# Patient Record
Sex: Female | Born: 1975 | Race: White | Hispanic: No | Marital: Married | Smoking: Former smoker
Health system: Southern US, Community
[De-identification: ages and names within clinical notes are randomized; demographics above are authoritative.]

## PROBLEM LIST (undated history)

## (undated) ENCOUNTER — Inpatient Hospital Stay (HOSPITAL_COMMUNITY): Payer: Self-pay

## (undated) DIAGNOSIS — N200 Calculus of kidney: Secondary | ICD-10-CM

## (undated) DIAGNOSIS — O99210 Obesity complicating pregnancy, unspecified trimester: Secondary | ICD-10-CM

## (undated) DIAGNOSIS — O34219 Maternal care for unspecified type scar from previous cesarean delivery: Secondary | ICD-10-CM

## (undated) DIAGNOSIS — Z87891 Personal history of nicotine dependence: Secondary | ICD-10-CM

## (undated) HISTORY — DX: Maternal care for unspecified type scar from previous cesarean delivery: O34.219

## (undated) HISTORY — DX: Obesity complicating pregnancy, unspecified trimester: O99.210

---

## 2003-01-21 ENCOUNTER — Encounter: Admission: RE | Admit: 2003-01-21 | Discharge: 2003-01-21 | Payer: Self-pay | Admitting: Family Medicine

## 2003-11-10 ENCOUNTER — Emergency Department (HOSPITAL_COMMUNITY): Admission: EM | Admit: 2003-11-10 | Discharge: 2003-11-10 | Payer: Self-pay | Admitting: Emergency Medicine

## 2004-01-25 HISTORY — PX: NEUROPLASTY / TRANSPOSITION MEDIAN NERVE AT CARPAL TUNNEL: SUR893

## 2007-02-28 ENCOUNTER — Emergency Department (HOSPITAL_COMMUNITY): Admission: EM | Admit: 2007-02-28 | Discharge: 2007-02-28 | Payer: Self-pay | Admitting: Emergency Medicine

## 2007-09-22 ENCOUNTER — Emergency Department (HOSPITAL_BASED_OUTPATIENT_CLINIC_OR_DEPARTMENT_OTHER): Admission: EM | Admit: 2007-09-22 | Discharge: 2007-09-23 | Payer: Self-pay | Admitting: Emergency Medicine

## 2007-09-27 ENCOUNTER — Emergency Department (HOSPITAL_COMMUNITY): Admission: EM | Admit: 2007-09-27 | Discharge: 2007-09-27 | Payer: Self-pay | Admitting: Emergency Medicine

## 2007-10-09 ENCOUNTER — Emergency Department (HOSPITAL_COMMUNITY): Admission: EM | Admit: 2007-10-09 | Discharge: 2007-10-09 | Payer: Self-pay | Admitting: Family Medicine

## 2008-11-06 ENCOUNTER — Emergency Department (HOSPITAL_COMMUNITY): Admission: EM | Admit: 2008-11-06 | Discharge: 2008-11-06 | Payer: Self-pay | Admitting: Family Medicine

## 2009-05-29 ENCOUNTER — Emergency Department (HOSPITAL_COMMUNITY): Admission: EM | Admit: 2009-05-29 | Discharge: 2009-05-29 | Payer: Self-pay | Admitting: Emergency Medicine

## 2009-06-09 ENCOUNTER — Inpatient Hospital Stay (HOSPITAL_COMMUNITY): Admission: AD | Admit: 2009-06-09 | Discharge: 2009-06-09 | Payer: Self-pay | Admitting: Obstetrics and Gynecology

## 2009-06-12 ENCOUNTER — Ambulatory Visit: Admission: RE | Admit: 2009-06-12 | Discharge: 2009-06-12 | Payer: Self-pay | Admitting: Family Medicine

## 2010-01-24 NOTE — L&D Delivery Note (Signed)
Delivery Note At 5:26 PM a viable female was delivered via VBAC, Spontaneous (Presentation:LOA ;  ).  APGAR: , ; weight .   Placenta status: spont via shultz, .  Cord:3 vc  with the following complications: none.   Anesthesia: Epidural  Episiotomy: None Lacerations: None Suture Repair: none Est. Blood Loss350 (mL):   Mom to postpartum.  Baby to nursery-stable.  Zerita Boers 10/10/2010, 5:39 PM

## 2010-02-11 ENCOUNTER — Inpatient Hospital Stay (HOSPITAL_COMMUNITY)
Admission: AD | Admit: 2010-02-11 | Discharge: 2010-02-11 | Payer: Self-pay | Source: Home / Self Care | Admitting: Obstetrics & Gynecology

## 2010-02-15 LAB — URINE MICROSCOPIC-ADD ON

## 2010-02-15 LAB — URINALYSIS, ROUTINE W REFLEX MICROSCOPIC
Bilirubin Urine: NEGATIVE
Ketones, ur: NEGATIVE mg/dL
Leukocytes, UA: NEGATIVE
Nitrite: NEGATIVE
Protein, ur: NEGATIVE mg/dL
Specific Gravity, Urine: 1.01 (ref 1.005–1.030)
Urine Glucose, Fasting: NEGATIVE mg/dL
Urobilinogen, UA: 0.2 mg/dL (ref 0.0–1.0)
pH: 6.5 (ref 5.0–8.0)

## 2010-02-15 LAB — CBC
HCT: 36.5 % (ref 36.0–46.0)
Hemoglobin: 12.4 g/dL (ref 12.0–15.0)
MCH: 33.6 pg (ref 26.0–34.0)
MCHC: 34 g/dL (ref 30.0–36.0)
MCV: 98.9 fL (ref 78.0–100.0)
Platelets: 242 10*3/uL (ref 150–400)
RBC: 3.69 MIL/uL — ABNORMAL LOW (ref 3.87–5.11)
RDW: 12.7 % (ref 11.5–15.5)
WBC: 11.2 10*3/uL — ABNORMAL HIGH (ref 4.0–10.5)

## 2010-02-15 LAB — WET PREP, GENITAL
Clue Cells Wet Prep HPF POC: NONE SEEN
Trich, Wet Prep: NONE SEEN
Yeast Wet Prep HPF POC: NONE SEEN

## 2010-02-15 LAB — POCT PREGNANCY, URINE: Preg Test, Ur: POSITIVE

## 2010-02-15 LAB — GC/CHLAMYDIA PROBE AMP, GENITAL
Chlamydia, DNA Probe: NEGATIVE
GC Probe Amp, Genital: NEGATIVE

## 2010-02-15 LAB — HCG, QUANTITATIVE, PREGNANCY: hCG, Beta Chain, Quant, S: 24040 m[IU]/mL — ABNORMAL HIGH (ref <5)

## 2010-04-12 LAB — URINALYSIS, ROUTINE W REFLEX MICROSCOPIC
Bilirubin Urine: NEGATIVE
Glucose, UA: NEGATIVE mg/dL
Ketones, ur: NEGATIVE mg/dL
Leukocytes, UA: NEGATIVE
Nitrite: NEGATIVE
Protein, ur: NEGATIVE mg/dL
Specific Gravity, Urine: 1.025 (ref 1.005–1.030)
Urobilinogen, UA: 1 mg/dL (ref 0.0–1.0)
pH: 7 (ref 5.0–8.0)

## 2010-04-12 LAB — GC/CHLAMYDIA PROBE AMP, GENITAL
Chlamydia, DNA Probe: NEGATIVE
GC Probe Amp, Genital: NEGATIVE

## 2010-04-12 LAB — HCG, QUANTITATIVE, PREGNANCY
hCG, Beta Chain, Quant, S: 11 m[IU]/mL — ABNORMAL HIGH (ref <5)
hCG, Beta Chain, Quant, S: 4 m[IU]/mL (ref <5)

## 2010-04-12 LAB — URINE MICROSCOPIC-ADD ON

## 2010-04-12 LAB — HCG, SERUM, QUALITATIVE: Preg, Serum: POSITIVE — AB

## 2010-04-12 LAB — POCT PREGNANCY, URINE: Preg Test, Ur: NEGATIVE

## 2010-04-12 LAB — ABO/RH: ABO/RH(D): O POS

## 2010-04-12 LAB — WET PREP, GENITAL
Clue Cells Wet Prep HPF POC: NONE SEEN
Trich, Wet Prep: NONE SEEN
Yeast Wet Prep HPF POC: NONE SEEN

## 2010-04-29 LAB — POCT URINALYSIS DIP (DEVICE)
Bilirubin Urine: NEGATIVE
Glucose, UA: NEGATIVE mg/dL
Ketones, ur: NEGATIVE mg/dL
Nitrite: NEGATIVE
Protein, ur: NEGATIVE mg/dL
Specific Gravity, Urine: 1.01 (ref 1.005–1.030)
Urobilinogen, UA: 0.2 mg/dL (ref 0.0–1.0)
pH: 7 (ref 5.0–8.0)

## 2010-04-29 LAB — WET PREP, GENITAL
Trich, Wet Prep: NONE SEEN
Yeast Wet Prep HPF POC: NONE SEEN

## 2010-04-29 LAB — POCT PREGNANCY, URINE: Preg Test, Ur: NEGATIVE

## 2010-04-29 LAB — GC/CHLAMYDIA PROBE AMP, GENITAL: Chlamydia, DNA Probe: NEGATIVE

## 2010-05-17 ENCOUNTER — Other Ambulatory Visit (HOSPITAL_COMMUNITY): Payer: Self-pay | Admitting: Obstetrics and Gynecology

## 2010-05-17 DIAGNOSIS — Z0489 Encounter for examination and observation for other specified reasons: Secondary | ICD-10-CM

## 2010-05-17 DIAGNOSIS — O09529 Supervision of elderly multigravida, unspecified trimester: Secondary | ICD-10-CM

## 2010-05-18 ENCOUNTER — Other Ambulatory Visit (HOSPITAL_COMMUNITY)
Admission: RE | Admit: 2010-05-18 | Discharge: 2010-05-18 | Disposition: A | Discharge status: Home / Self Care | Payer: 59 | Source: Ambulatory Visit | Attending: Obstetrics and Gynecology | Admitting: Obstetrics and Gynecology

## 2010-05-18 ENCOUNTER — Encounter: Payer: 59 | Admitting: Obstetrics and Gynecology

## 2010-05-18 ENCOUNTER — Other Ambulatory Visit: Payer: Self-pay | Admitting: Obstetrics & Gynecology

## 2010-05-18 DIAGNOSIS — Z01419 Encounter for gynecological examination (general) (routine) without abnormal findings: Secondary | ICD-10-CM | POA: Insufficient documentation

## 2010-05-18 DIAGNOSIS — Z113 Encounter for screening for infections with a predominantly sexual mode of transmission: Secondary | ICD-10-CM | POA: Insufficient documentation

## 2010-05-18 DIAGNOSIS — Z3480 Encounter for supervision of other normal pregnancy, unspecified trimester: Secondary | ICD-10-CM

## 2010-05-18 DIAGNOSIS — O34219 Maternal care for unspecified type scar from previous cesarean delivery: Secondary | ICD-10-CM

## 2010-05-18 DIAGNOSIS — Z3689 Encounter for other specified antenatal screening: Secondary | ICD-10-CM

## 2010-05-19 ENCOUNTER — Other Ambulatory Visit (HOSPITAL_COMMUNITY): Payer: Self-pay

## 2010-05-19 ENCOUNTER — Ambulatory Visit (HOSPITAL_COMMUNITY)
Admission: RE | Admit: 2010-05-19 | Discharge: 2010-05-19 | Disposition: A | Discharge status: Home / Self Care | Payer: 59 | Source: Ambulatory Visit | Attending: Obstetrics & Gynecology | Admitting: Obstetrics & Gynecology

## 2010-05-19 DIAGNOSIS — O09529 Supervision of elderly multigravida, unspecified trimester: Secondary | ICD-10-CM | POA: Insufficient documentation

## 2010-05-19 DIAGNOSIS — Z3689 Encounter for other specified antenatal screening: Secondary | ICD-10-CM

## 2010-05-19 DIAGNOSIS — O358XX0 Maternal care for other (suspected) fetal abnormality and damage, not applicable or unspecified: Secondary | ICD-10-CM | POA: Insufficient documentation

## 2010-05-19 DIAGNOSIS — Z363 Encounter for antenatal screening for malformations: Secondary | ICD-10-CM | POA: Insufficient documentation

## 2010-05-19 DIAGNOSIS — Z1389 Encounter for screening for other disorder: Secondary | ICD-10-CM | POA: Insufficient documentation

## 2010-05-26 ENCOUNTER — Other Ambulatory Visit: Payer: Self-pay | Admitting: Obstetrics & Gynecology

## 2010-06-15 ENCOUNTER — Encounter (INDEPENDENT_AMBULATORY_CARE_PROVIDER_SITE_OTHER): Payer: 59 | Admitting: Obstetrics & Gynecology

## 2010-06-15 DIAGNOSIS — O09529 Supervision of elderly multigravida, unspecified trimester: Secondary | ICD-10-CM

## 2010-06-15 DIAGNOSIS — Z3480 Encounter for supervision of other normal pregnancy, unspecified trimester: Secondary | ICD-10-CM

## 2010-06-15 DIAGNOSIS — O34219 Maternal care for unspecified type scar from previous cesarean delivery: Secondary | ICD-10-CM

## 2010-07-13 ENCOUNTER — Encounter (INDEPENDENT_AMBULATORY_CARE_PROVIDER_SITE_OTHER): Payer: 59 | Admitting: Obstetrics & Gynecology

## 2010-07-13 DIAGNOSIS — Z3480 Encounter for supervision of other normal pregnancy, unspecified trimester: Secondary | ICD-10-CM

## 2010-07-13 DIAGNOSIS — O34219 Maternal care for unspecified type scar from previous cesarean delivery: Secondary | ICD-10-CM

## 2010-07-13 DIAGNOSIS — O09529 Supervision of elderly multigravida, unspecified trimester: Secondary | ICD-10-CM

## 2010-07-19 ENCOUNTER — Other Ambulatory Visit (INDEPENDENT_AMBULATORY_CARE_PROVIDER_SITE_OTHER): Payer: 59

## 2010-07-19 DIAGNOSIS — Z3400 Encounter for supervision of normal first pregnancy, unspecified trimester: Secondary | ICD-10-CM

## 2010-07-19 LAB — RPR: RPR: NONREACTIVE

## 2010-07-19 LAB — RUBELLA ANTIBODY, IGM: Rubella: IMMUNE

## 2010-07-19 LAB — HIV ANTIBODY (ROUTINE TESTING W REFLEX): HIV: NONREACTIVE

## 2010-07-27 ENCOUNTER — Encounter (INDEPENDENT_AMBULATORY_CARE_PROVIDER_SITE_OTHER): Payer: 59 | Admitting: Obstetrics & Gynecology

## 2010-07-27 DIAGNOSIS — O34219 Maternal care for unspecified type scar from previous cesarean delivery: Secondary | ICD-10-CM

## 2010-07-27 DIAGNOSIS — Z3480 Encounter for supervision of other normal pregnancy, unspecified trimester: Secondary | ICD-10-CM

## 2010-07-27 DIAGNOSIS — O09529 Supervision of elderly multigravida, unspecified trimester: Secondary | ICD-10-CM

## 2010-07-27 DIAGNOSIS — IMO0002 Reserved for concepts with insufficient information to code with codable children: Secondary | ICD-10-CM

## 2010-08-16 ENCOUNTER — Encounter (INDEPENDENT_AMBULATORY_CARE_PROVIDER_SITE_OTHER): Payer: 59 | Admitting: Obstetrics and Gynecology

## 2010-08-16 DIAGNOSIS — IMO0002 Reserved for concepts with insufficient information to code with codable children: Secondary | ICD-10-CM

## 2010-08-16 DIAGNOSIS — O34219 Maternal care for unspecified type scar from previous cesarean delivery: Secondary | ICD-10-CM

## 2010-08-16 DIAGNOSIS — O09529 Supervision of elderly multigravida, unspecified trimester: Secondary | ICD-10-CM

## 2010-08-16 DIAGNOSIS — Z3480 Encounter for supervision of other normal pregnancy, unspecified trimester: Secondary | ICD-10-CM

## 2010-09-06 ENCOUNTER — Ambulatory Visit (INDEPENDENT_AMBULATORY_CARE_PROVIDER_SITE_OTHER): Payer: 59 | Admitting: Obstetrics and Gynecology

## 2010-09-06 DIAGNOSIS — IMO0002 Reserved for concepts with insufficient information to code with codable children: Secondary | ICD-10-CM

## 2010-09-06 DIAGNOSIS — Z3480 Encounter for supervision of other normal pregnancy, unspecified trimester: Secondary | ICD-10-CM

## 2010-09-08 ENCOUNTER — Other Ambulatory Visit: Payer: Self-pay | Admitting: Obstetrics and Gynecology

## 2010-09-08 DIAGNOSIS — O3660X0 Maternal care for excessive fetal growth, unspecified trimester, not applicable or unspecified: Secondary | ICD-10-CM

## 2010-09-15 ENCOUNTER — Ambulatory Visit (HOSPITAL_COMMUNITY)
Admission: RE | Admit: 2010-09-15 | Discharge: 2010-09-15 | Disposition: A | Discharge status: Home / Self Care | Payer: 59 | Source: Ambulatory Visit | Attending: Obstetrics and Gynecology | Admitting: Obstetrics and Gynecology

## 2010-09-15 ENCOUNTER — Ambulatory Visit (INDEPENDENT_AMBULATORY_CARE_PROVIDER_SITE_OTHER): Payer: 59 | Admitting: Obstetrics and Gynecology

## 2010-09-15 DIAGNOSIS — O3660X0 Maternal care for excessive fetal growth, unspecified trimester, not applicable or unspecified: Secondary | ICD-10-CM | POA: Insufficient documentation

## 2010-09-15 DIAGNOSIS — O09529 Supervision of elderly multigravida, unspecified trimester: Secondary | ICD-10-CM | POA: Insufficient documentation

## 2010-09-15 DIAGNOSIS — IMO0002 Reserved for concepts with insufficient information to code with codable children: Secondary | ICD-10-CM

## 2010-09-15 DIAGNOSIS — O34219 Maternal care for unspecified type scar from previous cesarean delivery: Secondary | ICD-10-CM

## 2010-09-15 DIAGNOSIS — Z3480 Encounter for supervision of other normal pregnancy, unspecified trimester: Secondary | ICD-10-CM

## 2010-09-16 LAB — GC/CHLAMYDIA PROBE AMP, GENITAL: GC Probe Amp, Genital: NEGATIVE

## 2010-09-22 ENCOUNTER — Ambulatory Visit (INDEPENDENT_AMBULATORY_CARE_PROVIDER_SITE_OTHER): Payer: 59 | Admitting: Obstetrics & Gynecology

## 2010-09-22 DIAGNOSIS — Z3480 Encounter for supervision of other normal pregnancy, unspecified trimester: Secondary | ICD-10-CM

## 2010-09-22 DIAGNOSIS — K219 Gastro-esophageal reflux disease without esophagitis: Secondary | ICD-10-CM

## 2010-09-22 DIAGNOSIS — O34219 Maternal care for unspecified type scar from previous cesarean delivery: Secondary | ICD-10-CM

## 2010-09-22 MED ORDER — PANTOPRAZOLE SODIUM 40 MG PO TBEC: 40.0000 mg | DELAYED_RELEASE_TABLET | Freq: Every day | ORAL | Status: DC

## 2010-09-29 ENCOUNTER — Encounter (INDEPENDENT_AMBULATORY_CARE_PROVIDER_SITE_OTHER): Payer: 59 | Admitting: Obstetrics and Gynecology

## 2010-09-29 DIAGNOSIS — Z3480 Encounter for supervision of other normal pregnancy, unspecified trimester: Secondary | ICD-10-CM

## 2010-10-04 ENCOUNTER — Other Ambulatory Visit: Payer: Self-pay | Admitting: Obstetrics and Gynecology

## 2010-10-04 ENCOUNTER — Ambulatory Visit: Payer: 59 | Admitting: Obstetrics and Gynecology

## 2010-10-04 DIAGNOSIS — O480 Post-term pregnancy: Secondary | ICD-10-CM

## 2010-10-04 DIAGNOSIS — Z7189 Other specified counseling: Secondary | ICD-10-CM

## 2010-10-08 ENCOUNTER — Ambulatory Visit (HOSPITAL_COMMUNITY)
Admission: RE | Admit: 2010-10-08 | Discharge: 2010-10-08 | Disposition: A | Discharge status: Home / Self Care | Payer: 59 | Source: Ambulatory Visit | Attending: Obstetrics and Gynecology | Admitting: Obstetrics and Gynecology

## 2010-10-08 ENCOUNTER — Encounter (HOSPITAL_COMMUNITY): Payer: Self-pay

## 2010-10-08 ENCOUNTER — Telehealth (HOSPITAL_COMMUNITY): Payer: Self-pay | Admitting: *Deleted

## 2010-10-08 ENCOUNTER — Inpatient Hospital Stay (HOSPITAL_COMMUNITY)
Admission: AD | Admit: 2010-10-08 | Discharge: 2010-10-08 | Disposition: A | Discharge status: Home / Self Care | Payer: 59 | Source: Ambulatory Visit | Attending: Obstetrics & Gynecology | Admitting: Obstetrics & Gynecology

## 2010-10-08 DIAGNOSIS — O480 Post-term pregnancy: Secondary | ICD-10-CM | POA: Insufficient documentation

## 2010-10-08 DIAGNOSIS — O09529 Supervision of elderly multigravida, unspecified trimester: Secondary | ICD-10-CM | POA: Insufficient documentation

## 2010-10-08 DIAGNOSIS — Z36 Encounter for antenatal screening of mother: Secondary | ICD-10-CM

## 2010-10-08 DIAGNOSIS — Z3689 Encounter for other specified antenatal screening: Secondary | ICD-10-CM

## 2010-10-08 NOTE — ED Provider Notes (Signed)
History     Chief Complaint  Patient presents with  . Non-stress Test    Pt is [redacted]w[redacted]d here for BPP and NST.   HPI Pt receives prenatal care at [facility]. She reports positive fetal movement. She denies vaginal bleeding, leakage of fluid and contractions.   OB History    Grav Para Term Preterm Abortions TAB SAB Ect Mult Living   2 1 1  0 0 0 0 0 0 1      Past Medical History  Diagnosis Date  . Previous cesarean delivery, antepartum condition or complication   . Maternal obesity syndrome, antepartum     Past Surgical History  Procedure Date  . Cesarean section     No family history on file.  History  Substance Use Topics  . Smoking status: Former Games developer  . Smokeless tobacco: Not on file  . Alcohol Use: No    Allergies:  Allergies  Allergen Reactions  . Codeine Hives    Prescriptions prior to admission  Medication Sig Dispense Refill  . Calcium Carbonate (CALCIUM 500 PO) Take 1 tablet by mouth daily.        . calcium carbonate (TUMS - DOSED IN MG ELEMENTAL CALCIUM) 500 MG chewable tablet Chew 1 tablet by mouth daily. For heartburn.       . cholecalciferol (VITAMIN D) 1000 UNITS tablet Take 1,000 Units by mouth daily.        . vitamin C (ASCORBIC ACID) 250 MG tablet Take 250 mg by mouth daily.        Burnis Medin w/o A-FE-DSS-Methfol-FA (PRENATAL MULTIVITAMIN) 90-600-400 MG-MCG-MCG tablet Take 1 tablet by mouth daily.          Review of Systems  Constitutional: Negative.   HENT: Negative.   Gastrointestinal: Negative.     Physical Exam   There were no vitals taken for this visit.  Physical Exam  Nursing note and vitals reviewed. Constitutional: She appears well-developed and well-nourished. No distress.  GI:       Gravid, +FH, vertex, 7 lb 6 oz by Leopolds.   Musculoskeletal: She exhibits no edema.   Dilation: 1  Effacement (%): 20  Cervical Position: Posterior Station: -3  Presentation: Vertex Exam by:: Dr. Armen Pickup   BPP: 8/8 NST: reactive, 4  accles in 20 minutes.  FHR: 140 baseline, +accels 15x15 , no decels.   MAU Course  Procedures: BPP, NST, digital cervical exam  MDM D/c to home.   Assessment and Plan  35 yo F at [redacted]w[redacted]d here for intrapartum fetal surveillance for postdates. IOL scheduled from Tuesday 10/12/10. 1. Reassuring fetal surveillance not in active labor: plan to discharge to home, instructions of labor and kick counts. F/u in 4 days for scheduled induction.   Donna Stevens 10/08/2010, 9:57 AM

## 2010-10-10 ENCOUNTER — Inpatient Hospital Stay (HOSPITAL_COMMUNITY)
Admission: AD | Admit: 2010-10-10 | Discharge: 2010-10-12 | DRG: 775 | Disposition: A | Discharge status: Home / Self Care | Payer: 59 | Source: Ambulatory Visit | Attending: Obstetrics and Gynecology | Admitting: Obstetrics and Gynecology

## 2010-10-10 ENCOUNTER — Inpatient Hospital Stay (HOSPITAL_COMMUNITY): Payer: 59 | Admitting: Anesthesiology

## 2010-10-10 ENCOUNTER — Encounter (HOSPITAL_COMMUNITY): Payer: Self-pay | Admitting: *Deleted

## 2010-10-10 ENCOUNTER — Encounter (HOSPITAL_COMMUNITY): Payer: Self-pay | Admitting: Anesthesiology

## 2010-10-10 DIAGNOSIS — O34219 Maternal care for unspecified type scar from previous cesarean delivery: Principal | ICD-10-CM | POA: Diagnosis present

## 2010-10-10 DIAGNOSIS — O09529 Supervision of elderly multigravida, unspecified trimester: Secondary | ICD-10-CM | POA: Diagnosis present

## 2010-10-10 LAB — CBC
HCT: 34.6 % — ABNORMAL LOW (ref 36.0–46.0)
MCH: 32.6 pg (ref 26.0–34.0)
MCV: 99.7 fL (ref 78.0–100.0)
Platelets: 171 10*3/uL (ref 150–400)
RDW: 13.6 % (ref 11.5–15.5)

## 2010-10-10 MED ORDER — IBUPROFEN 600 MG PO TABS
600.0000 mg | ORAL_TABLET | Freq: Four times a day (QID) | ORAL | Status: DC
  Administered 2010-10-10 – 2010-10-12 (×7): 600 mg via ORAL
  Filled 2010-10-10 (×7): qty 1

## 2010-10-10 MED ORDER — PRENATAL PLUS 27-1 MG PO TABS
1.0000 | ORAL_TABLET | Freq: Every day | ORAL | Status: DC
  Administered 2010-10-10 – 2010-10-12 (×3): 1 via ORAL
  Filled 2010-10-10 (×3): qty 1

## 2010-10-10 MED ORDER — BENZOCAINE-MENTHOL 20-0.5 % EX AERO
INHALATION_SPRAY | CUTANEOUS | Status: AC
  Filled 2010-10-10: qty 56

## 2010-10-10 MED ORDER — ONDANSETRON HCL 4 MG/2ML IJ SOLN: 4.0000 mg | INTRAMUSCULAR | Status: DC | PRN

## 2010-10-10 MED ORDER — DIPHENHYDRAMINE HCL 50 MG/ML IJ SOLN: 12.5000 mg | INTRAMUSCULAR | Status: DC | PRN

## 2010-10-10 MED ORDER — OXYTOCIN 10 UNIT/ML IJ SOLN
INTRAMUSCULAR | Status: AC
  Administered 2010-10-10: 10 [IU] via INTRAMUSCULAR
  Filled 2010-10-10: qty 1

## 2010-10-10 MED ORDER — OXYTOCIN 20 UNITS IN LACTATED RINGERS INFUSION - SIMPLE
1.0000 m[IU]/min | INTRAVENOUS | Status: DC
  Administered 2010-10-10: 2 m[IU]/min via INTRAVENOUS
  Administered 2010-10-10: 500 m[IU]/min via INTRAVENOUS
  Filled 2010-10-10: qty 1000

## 2010-10-10 MED ORDER — SODIUM BICARBONATE 8.4 % IV SOLN
INTRAVENOUS | Status: DC | PRN
  Administered 2010-10-10: 5 mL via EPIDURAL

## 2010-10-10 MED ORDER — SENNOSIDES-DOCUSATE SODIUM 8.6-50 MG PO TABS
2.0000 | ORAL_TABLET | Freq: Every day | ORAL | Status: DC
  Administered 2010-10-10 – 2010-10-11 (×2): 2 via ORAL

## 2010-10-10 MED ORDER — EPHEDRINE 5 MG/ML INJ
10.0000 mg | INTRAVENOUS | Status: DC | PRN
  Filled 2010-10-10: qty 4

## 2010-10-10 MED ORDER — OXYTOCIN BOLUS FROM INFUSION
500.0000 mL | Freq: Once | INTRAVENOUS | Status: DC
  Filled 2010-10-10: qty 500

## 2010-10-10 MED ORDER — TETANUS-DIPHTH-ACELL PERTUSSIS 5-2.5-18.5 LF-MCG/0.5 IM SUSP
0.5000 mL | Freq: Once | INTRAMUSCULAR | Status: AC
  Administered 2010-10-11: 0.5 mL via INTRAMUSCULAR
  Filled 2010-10-10: qty 0.5

## 2010-10-10 MED ORDER — ACETAMINOPHEN 325 MG PO TABS: 650.0000 mg | ORAL_TABLET | ORAL | Status: DC | PRN

## 2010-10-10 MED ORDER — BENZOCAINE-MENTHOL 20-0.5 % EX AERO: 1.0000 "application " | INHALATION_SPRAY | CUTANEOUS | Status: DC | PRN

## 2010-10-10 MED ORDER — LACTATED RINGERS IV SOLN: 500.0000 mL | Freq: Once | INTRAVENOUS | Status: DC

## 2010-10-10 MED ORDER — FLEET ENEMA 7-19 GM/118ML RE ENEM: 1.0000 | ENEMA | RECTAL | Status: DC | PRN

## 2010-10-10 MED ORDER — EPHEDRINE 5 MG/ML INJ
10.0000 mg | INTRAVENOUS | Status: DC | PRN
  Filled 2010-10-10 (×2): qty 4

## 2010-10-10 MED ORDER — LACTATED RINGERS IV SOLN
INTRAVENOUS | Status: DC
  Administered 2010-10-10: 12:00:00 via INTRAVENOUS
  Administered 2010-10-10: 500 mL via INTRAVENOUS
  Administered 2010-10-10: 08:00:00 via INTRAVENOUS
  Administered 2010-10-10: 500 mL via INTRAVENOUS

## 2010-10-10 MED ORDER — LANOLIN HYDROUS EX OINT: TOPICAL_OINTMENT | CUTANEOUS | Status: DC | PRN

## 2010-10-10 MED ORDER — TRAMADOL HCL 50 MG PO TABS: 50.0000 mg | ORAL_TABLET | Freq: Four times a day (QID) | ORAL | Status: DC | PRN

## 2010-10-10 MED ORDER — LACTATED RINGERS IV SOLN: 500.0000 mL | INTRAVENOUS | Status: DC | PRN

## 2010-10-10 MED ORDER — FENTANYL 2.5 MCG/ML BUPIVACAINE 1/10 % EPIDURAL INFUSION (WH - ANES)
14.0000 mL/h | INTRAMUSCULAR | Status: DC
  Administered 2010-10-10 (×3): 14 mL/h via EPIDURAL
  Filled 2010-10-10 (×3): qty 60

## 2010-10-10 MED ORDER — SIMETHICONE 80 MG PO CHEW: 80.0000 mg | CHEWABLE_TABLET | ORAL | Status: DC | PRN

## 2010-10-10 MED ORDER — PHENYLEPHRINE 40 MCG/ML (10ML) SYRINGE FOR IV PUSH (FOR BLOOD PRESSURE SUPPORT)
80.0000 ug | PREFILLED_SYRINGE | INTRAVENOUS | Status: DC | PRN
  Filled 2010-10-10 (×2): qty 5

## 2010-10-10 MED ORDER — DIPHENHYDRAMINE HCL 25 MG PO CAPS: 25.0000 mg | ORAL_CAPSULE | Freq: Four times a day (QID) | ORAL | Status: DC | PRN

## 2010-10-10 MED ORDER — LIDOCAINE HCL (PF) 1 % IJ SOLN
30.0000 mL | INTRAMUSCULAR | Status: DC | PRN
  Filled 2010-10-10: qty 30

## 2010-10-10 MED ORDER — IBUPROFEN 600 MG PO TABS: 600.0000 mg | ORAL_TABLET | Freq: Four times a day (QID) | ORAL | Status: DC | PRN

## 2010-10-10 MED ORDER — DIBUCAINE 1 % RE OINT: 1.0000 "application " | TOPICAL_OINTMENT | RECTAL | Status: DC | PRN

## 2010-10-10 MED ORDER — ZOLPIDEM TARTRATE 5 MG PO TABS: 5.0000 mg | ORAL_TABLET | Freq: Every evening | ORAL | Status: DC | PRN

## 2010-10-10 MED ORDER — TERBUTALINE SULFATE 1 MG/ML IJ SOLN: 0.2500 mg | Freq: Once | INTRAMUSCULAR | Status: DC | PRN

## 2010-10-10 MED ORDER — ONDANSETRON HCL 4 MG PO TABS: 4.0000 mg | ORAL_TABLET | ORAL | Status: DC | PRN

## 2010-10-10 MED ORDER — OXYTOCIN 20 UNITS IN LACTATED RINGERS INFUSION - SIMPLE: 125.0000 mL/h | Freq: Once | INTRAVENOUS | Status: DC

## 2010-10-10 MED ORDER — PHENYLEPHRINE 40 MCG/ML (10ML) SYRINGE FOR IV PUSH (FOR BLOOD PRESSURE SUPPORT)
80.0000 ug | PREFILLED_SYRINGE | INTRAVENOUS | Status: DC | PRN
  Filled 2010-10-10: qty 5

## 2010-10-10 MED ORDER — WITCH HAZEL-GLYCERIN EX PADS: 1.0000 "application " | MEDICATED_PAD | CUTANEOUS | Status: DC | PRN

## 2010-10-10 MED ORDER — CITRIC ACID-SODIUM CITRATE 334-500 MG/5ML PO SOLN: 30.0000 mL | ORAL | Status: DC | PRN

## 2010-10-10 MED ORDER — ONDANSETRON HCL 4 MG/2ML IJ SOLN: 4.0000 mg | Freq: Four times a day (QID) | INTRAMUSCULAR | Status: DC | PRN

## 2010-10-10 NOTE — Anesthesia Procedure Notes (Signed)
Epidural Patient location during procedure: OB Start time: 10/10/2010 9:08 AM  Staffing Anesthesiologist: Jiles Garter  Preanesthetic Checklist Completed: patient identified, site marked, surgical consent, pre-op evaluation, timeout performed, IV checked, risks and benefits discussed and monitors and equipment checked  Epidural Patient position: sitting Prep: site prepped and draped and DuraPrep Patient monitoring: continuous pulse ox and blood pressure Approach: midline Injection technique: LOR air  Needle:  Needle type: Tuohy  Needle gauge: 17 G Needle length: 9 cm Needle insertion depth: 7 cm Catheter type: closed end flexible Catheter size: 19 Gauge Catheter at skin depth: 14 cm Test dose: negative  Assessment Events: blood not aspirated, injection not painful, no injection resistance, negative IV test and no paresthesia  Additional Notes Dosing of Epidural: 1st dose, through needle...... ( mg expressed as equavilent  cc's medication  from .1%Bupiv / fentanyl syringe from L&D pump)...............  5mg  Marcaine  2nd dose, through catheter after waiting 3 minutes.... epi 1:200K + Xylocaine 40 mg 3rd dose, through catheter, after waiting 3 minutes...Marland KitchenMarland Kitchenepi 1:200K + Xylocaine 60 mg ( 2% Xylo charted as a single dose in Epic Meds for ease of charting; actual dosing was fractionated as above, for saftey's sake)  As each dose occurred, patient was free of IV sx; and patient exhibited no evidence of SA injection.  Patient is more comfortable after epidural dosed. Please see RN's note for documentation of vital signs,and FHR which are stable.

## 2010-10-10 NOTE — Anesthesia Preprocedure Evaluation (Signed)
Anesthesia Evaluation  Name, MR# and DOB Patient awake  General Assessment Comment  Reviewed: Allergy & Precautions, H&P , Patient's Chart, lab work & pertinent test results  Airway Mallampati: II TM Distance: >3 FB Neck ROM: full    Dental  (+) Teeth Intact   Pulmonary  clear to auscultation  breath sounds clear to auscultation none    Cardiovascular regular Normal    Neuro/Psych   GI/Hepatic/Renal   Endo/Other  (+)   Morbid obesity  Abdominal   Musculoskeletal   Hematology   Peds  Reproductive/Obstetrics (+) Pregnancy    Anesthesia Other Findings                 Anesthesia Physical Anesthesia Plan  ASA: III  Anesthesia Plan: Epidural   Post-op Pain Management:    Induction:   Airway Management Planned:   Additional Equipment:   Intra-op Plan:   Post-operative Plan:   Informed Consent: I have reviewed the patients History and Physical, chart, labs and discussed the procedure including the risks, benefits and alternatives for the proposed anesthesia with the patient or authorized representative who has indicated his/her understanding and acceptance.   Dental Advisory Given  Plan Discussed with: CRNA and Surgeon  Anesthesia Plan Comments: (Patient is TLOC Labs checked- platelets confirmed with RN in room. Fetal heart tracing, per RN, reportedly stable enough for sitting procedure. Discussed epidural, and patient consents to the procedure:  included risk of possible headache,backache, failed block, allergic reaction, and nerve injury. This patient was asked if she had any questions or concerns before the procedure started. )        Anesthesia Quick Evaluation

## 2010-10-10 NOTE — Progress Notes (Signed)
Donna Stevens is a 35 y.o. G2P1001 at [redacted]w[redacted]d by ultrasound admitted for active labor  Subjective:   Objective: BP 125/65  Pulse 80  Temp(Src) 98.2 F (36.8 C) (Oral)  Resp 20  Ht 5\' 6"  (1.676 m)  Wt 106.595 kg (235 lb)  BMI 37.93 kg/m2  SpO2 98%      FHT:  FHR: 140's bpm, variability: moderate,  accelerations:  Present,  decelerations:  Absent UC:   irregular, every 4-6 minutes SVE:   Dilation: 4 Effacement (%): 100 Exam by:: Humphrey Rolls, RN  Labs: Lab Results  Component Value Date   WBC 11.5* 10/10/2010   HGB 11.3* 10/10/2010   HCT 34.6* 10/10/2010   MCV 99.7 10/10/2010   PLT 171 10/10/2010    Assessment / Plan: Spontaneous labor, progressing normally  Labor: Progressing normally Preeclampsia:  no signs or symptoms of toxicity Fetal Wellbeing:  Category I Pain Control:  Epidural I/D:  n/a Anticipated MOD:  NSVD  Zerita Boers 10/10/2010, 9:04 AM

## 2010-10-10 NOTE — Progress Notes (Signed)
SVE complete and +2 to +3, pushing poorly at present. fhr pattern reassuring.

## 2010-10-10 NOTE — Progress Notes (Signed)
Pt may go to room 167. 

## 2010-10-10 NOTE — H&P (Signed)
  History     Chief Complaint  Patient presents with  . Contractions   HPI Patient is a 34yo G2P1001 at 40.6 weeks by [place] with EDC of 10/04/10 who presents with contractions that started about 3am and were about every 3 minutes.  They have increased in intensity. Denies LOF, decreased FA, vaginal bleeding, vaginal discharge, fevers, chills, nausea, vomiting.  OB History    Grav Para Term Preterm Abortions TAB SAB Ect Mult Living   2 1 1  0 0 0 0 0 0 1      Past Medical History  Diagnosis Date  . Previous cesarean delivery, antepartum condition or complication   . Maternal obesity syndrome, antepartum     Past Surgical History  Procedure Date  . Cesarean section     No family history on file.  History  Substance Use Topics  . Smoking status: Former Games developer  . Smokeless tobacco: Not on file  . Alcohol Use: No    Allergies:  Allergies  Allergen Reactions  . Codeine Hives    Prescriptions prior to admission  Medication Sig Dispense Refill  . Calcium Carbonate (CALCIUM 500 PO) Take 1 tablet by mouth daily.        . calcium carbonate (TUMS - DOSED IN MG ELEMENTAL CALCIUM) 500 MG chewable tablet Chew 1 tablet by mouth daily. For heartburn.       . cholecalciferol (VITAMIN D) 1000 UNITS tablet Take 1,000 Units by mouth daily.        . vitamin C (ASCORBIC ACID) 250 MG tablet Take 250 mg by mouth daily.          Review of Systems  All other systems reviewed and are negative.   Physical Exam   Blood pressure 134/88, pulse 88, temperature 98.2 F (36.8 C), temperature source Oral, resp. rate 18, height 5\' 6"  (1.676 m), weight 106.595 kg (235 lb).  Physical Exam  Constitutional: She appears well-developed and well-nourished.  HENT:  Head: Normocephalic and atraumatic.  Eyes: Conjunctivae are normal. Pupils are equal, round, and reactive to light.  Neck: Normal range of motion. Neck supple.  Cardiovascular: Normal rate and regular rhythm.  Exam reveals no gallop and no  friction rub.   No murmur heard. Respiratory: Effort normal and breath sounds normal. No respiratory distress. She has no wheezes. She has no rales. She exhibits no tenderness.  GI: Soft. Bowel sounds are normal. She exhibits no distension and no mass. There is no tenderness. There is no rebound and no guarding.       Size equals dates.  EFW about 8.5 pounds.  Vertex by leopold maneuver.   Dilation: 4 Effacement (%): 100 Cervical Position: Middle Presentation: Vertex Exam by:: Humphrey Rolls, RN  FHR 130s, moderate variability, no accel, no decel. Toco q3 minutes  [place] 8/22 EFW 7#7oz [place] 9/14 BPP 8/8  Prenatal Labs: RPR nonreactive Rubella immune Hbsag neg HIV neg GBS neg GC/Chlamydia - neg  MAU Course  Procedures   Assessment and Plan  1.  34yo G2P1 with IUP at 40.[redacted] weeks EGA 2.  AMA 3.  GBS neg 4.  TOLAC  Will admit to L&D with monitoring.  Will start IV fluids and provide expectant managemtn. Patient desires to breastfeed and wants a circumcision for the baby.  Dr Donnie Coffin will be the baby's physician.  OCP for birth control.  Donna Stevens JEHIEL 10/10/2010, 7:08 AM

## 2010-10-10 NOTE — Progress Notes (Signed)
Dr. Adrian Blackwater notified of pt presenting for labor check. Notified of VE.  Will be up to admit pt.

## 2010-10-10 NOTE — ED Provider Notes (Signed)
History     Chief Complaint  Patient presents with  . Contractions   HPI Patient is a 34yo G2P1001 at 40.6 weeks by [place] with EDC of 10/04/10 who presents with contractions that started about 3am and were about every 3 minutes.  They have increased in intensity. Denies LOF, decreased FA, vaginal bleeding, vaginal discharge, fevers, chills, nausea, vomiting.  OB History    Grav Para Term Preterm Abortions TAB SAB Ect Mult Living   2 1 1  0 0 0 0 0 0 1      Past Medical History  Diagnosis Date  . Previous cesarean delivery, antepartum condition or complication   . Maternal obesity syndrome, antepartum     Past Surgical History  Procedure Date  . Cesarean section     No family history on file.  History  Substance Use Topics  . Smoking status: Former Games developer  . Smokeless tobacco: Not on file  . Alcohol Use: No    Allergies:  Allergies  Allergen Reactions  . Codeine Hives    Prescriptions prior to admission  Medication Sig Dispense Refill  . Calcium Carbonate (CALCIUM 500 PO) Take 1 tablet by mouth daily.        . calcium carbonate (TUMS - DOSED IN MG ELEMENTAL CALCIUM) 500 MG chewable tablet Chew 1 tablet by mouth daily. For heartburn.       . cholecalciferol (VITAMIN D) 1000 UNITS tablet Take 1,000 Units by mouth daily.        . vitamin C (ASCORBIC ACID) 250 MG tablet Take 250 mg by mouth daily.          Review of Systems  All other systems reviewed and are negative.   Physical Exam   Blood pressure 134/88, pulse 88, temperature 98.2 F (36.8 C), temperature source Oral, resp. rate 18, height 5\' 6"  (1.676 m), weight 106.595 kg (235 lb).  Physical Exam  Constitutional: She appears well-developed and well-nourished.  HENT:  Head: Normocephalic and atraumatic.  Eyes: Conjunctivae are normal. Pupils are equal, round, and reactive to light.  Neck: Normal range of motion. Neck supple.  Cardiovascular: Normal rate and regular rhythm.  Exam reveals no gallop and no  friction rub.   No murmur heard. Respiratory: Effort normal and breath sounds normal. No respiratory distress. She has no wheezes. She has no rales. She exhibits no tenderness.  GI: Soft. Bowel sounds are normal. She exhibits no distension and no mass. There is no tenderness. There is no rebound and no guarding.       Size equals dates.  EFW about 8.5 pounds.  Vertex by leopold maneuver.   Dilation: 4 Effacement (%): 100 Cervical Position: Middle Presentation: Vertex Exam by:: Humphrey Rolls, RN  FHR 130s, moderate variability, no accel, no decel. Toco q3 minutes  [place] 8/22 EFW 7#7oz [place] 9/14 BPP 8/8  Prenatal Labs: RPR nonreactive Rubella immune Hbsag neg HIV neg GBS neg GC/Chlamydia - neg  MAU Course  Procedures   Assessment and Plan  1.  34yo G2P1 with IUP at 40.[redacted] weeks EGA 2.  AMA 3.  GBS neg 4.  TOLAC  Will admit to L&D with monitoring.  Will start IV fluids and provide expectant managemtn. Patient desires to breastfeed and wants a circumcision for the baby.  Dr Donnie Coffin will be the baby's physician.  OCP for birth control.  STINSON, JACOB JEHIEL 10/10/2010, 7:08 AM

## 2010-10-10 NOTE — Progress Notes (Signed)
Donna Stevens is a 35 y.o. G2P1001 at [redacted]w[redacted]d by ultrasound admitted for active labor  Subjective:   Objective: BP 117/74  Pulse 71  Temp(Src) 98.3 F (36.8 C) (Oral)  Resp 20  Ht 5\' 6"  (1.676 m)  Wt 106.595 kg (235 lb)  BMI 37.93 kg/m2  SpO2 100%      FHT:  FHR: 135 bpm, variability: moderate,  accelerations:  Present,  decelerations:  Absent UC:   regular, every 2-3 minutes SVE:   Dilation: 6.5 Effacement (%): 100 Station: 0 Exam by:: Isac Sarna, RN, Kathleene Hazel, RNC  Labs: Lab Results  Component Value Date   WBC 11.5* 10/10/2010   HGB 11.3* 10/10/2010   HCT 34.6* 10/10/2010   MCV 99.7 10/10/2010   PLT 171 10/10/2010    Assessment / Plan: Augmentation of labor, progressing well  Labor: Progressing on Pitocin, will continue to increase then AROM Preeclampsia:  no signs or symptoms of toxicity Fetal Wellbeing:  Category I Pain Control:  Epidural I/D:  n/a Anticipated MOD:  NSVD  Zerita Boers 10/10/2010, 2:10 PM

## 2010-10-10 NOTE — Progress Notes (Signed)
Pt presents to mau for labor check.  efm and toco placed. Assessing.  

## 2010-10-10 NOTE — Progress Notes (Signed)
  FHR pattern reassuring. SVE 5/100/-1, AROM sm amt cl fluid. IUPC placed.

## 2010-10-10 NOTE — Progress Notes (Signed)
Dr. Adrian Blackwater at bedside.  poc discussed with pt.

## 2010-10-10 NOTE — Anesthesia Postprocedure Evaluation (Signed)
  Anesthesia Post-op Note  Patient: Donna Stevens  Procedure(s) Performed: * No procedures listed *  Patient Location: PACU and Mother/Baby  Anesthesia Type: Epidural  Level of Consciousness: awake, alert  and oriented  Airway and Oxygen Therapy: Patient Spontanous Breathing   Post-op Assessment: Patient's Cardiovascular Status Stable and Respiratory Function Stable  Post-op Vital Signs: stable  Complications: No apparent anesthesia complications

## 2010-10-11 MED ORDER — OXYCODONE-ACETAMINOPHEN 5-325 MG PO TABS: 2.0000 | ORAL_TABLET | ORAL | Status: DC | PRN

## 2010-10-11 NOTE — Progress Notes (Signed)
Post Partum Day 1 Subjective: no complaints, up ad lib, voiding, tolerating PO and + flatus  Objective: Blood pressure 100/63, pulse 98, temperature 97.9 F (36.6 C), temperature source Oral, resp. rate 18, height 5\' 6"  (1.676 m), weight 106.595 kg (235 lb), SpO2 100.00%, unknown if currently breastfeeding.  Physical Exam:  General: alert, cooperative, appears stated age and no distress Lochia: appropriate Uterine Fundus: firm Incision: n/a DVT Evaluation: No evidence of DVT seen on physical exam. Negative Homan's sign. No cords or calf tenderness. No significant calf/ankle edema.   Basename 10/10/10 0801  HGB 11.3*  HCT 34.6*    Assessment/Plan: Plan for discharge tomorrow   LOS: 1 day   Zerita Boers 10/11/2010, 6:57 AM

## 2010-10-12 ENCOUNTER — Inpatient Hospital Stay (HOSPITAL_COMMUNITY): Admission: RE | Admit: 2010-10-12 | Payer: 59 | Source: Ambulatory Visit

## 2010-10-12 MED ORDER — TRAMADOL HCL ER 100 MG PO TB24: 100.0000 mg | ORAL_TABLET | Freq: Every day | ORAL | Status: AC

## 2010-10-12 MED ORDER — NORETHINDRONE 0.35 MG PO TABS: 1.0000 | ORAL_TABLET | Freq: Every day | ORAL | Status: DC

## 2010-10-12 MED ORDER — IBUPROFEN 600 MG PO TABS: 600.0000 mg | ORAL_TABLET | Freq: Four times a day (QID) | ORAL | Status: AC

## 2010-10-12 NOTE — Progress Notes (Signed)
Post Partum Day 2 Subjective: no complaints, up ad lib, voiding, tolerating PO and + flatus  Objective: Blood pressure 106/73, pulse 97, temperature 98 F (36.7 C), temperature source Oral, resp. rate 18, height 5\' 6"  (1.676 m), weight 106.595 kg (235 lb), SpO2 96.00%, unknown if currently breastfeeding.  Physical Exam:  General: alert, cooperative, appears stated age and no distress Lochia: appropriate Uterine Fundus: firm Incision: n/a DVT Evaluation: No evidence of DVT seen on physical exam. Negative Homan's sign. No cords or calf tenderness. No significant calf/ankle edema.   Basename 10/10/10 0801  HGB 11.3*  HCT 34.6*    Assessment/Plan: Discharge home   LOS: 2 days   Zerita Boers 10/12/2010, 6:39 AM

## 2010-10-12 NOTE — Discharge Summary (Signed)
Obstetric Discharge Summary Reason for Admission: onset of labor Prenatal Procedures: ultrasound Intrapartum Procedures: spontaneous vaginal delivery Postpartum Procedures: none Complications-Operative and Postpartum: none Hemoglobin  Date Value Range Status  10/10/2010 11.3* 12.0-15.0 (g/dL) Final     HCT  Date Value Range Status  10/10/2010 34.6* 36.0-46.0 (%) Final    Discharge Diagnoses: Term Pregnancy-delivered  Discharge Information: Date: 10/12/2010 Activity: pelvic rest Diet: routine Medications: Ibuprophen and ultram Condition: stable and improved Instructions: refer to practice specific booklet Discharge to: home   Newborn Data: Live born female  Birth Weight: 8 lb 15.2 oz (4060 g) APGAR: 8, 9  Home with mother.  Zerita Boers 10/12/2010, 6:47 AM

## 2010-10-13 NOTE — Discharge Summary (Signed)
Agree with above note.  Donna Stevens H. 10/13/2010 9:40 AM  

## 2010-11-22 ENCOUNTER — Encounter: Payer: Self-pay | Admitting: Obstetrics & Gynecology

## 2010-11-22 ENCOUNTER — Ambulatory Visit (INDEPENDENT_AMBULATORY_CARE_PROVIDER_SITE_OTHER): Payer: 59 | Admitting: Obstetrics & Gynecology

## 2010-11-22 VITALS — BP 127/93 | HR 81 | Ht 67.0 in | Wt 220.0 lb

## 2010-11-22 DIAGNOSIS — Z124 Encounter for screening for malignant neoplasm of cervix: Secondary | ICD-10-CM

## 2010-11-22 DIAGNOSIS — Z23 Encounter for immunization: Secondary | ICD-10-CM

## 2010-11-22 DIAGNOSIS — IMO0001 Reserved for inherently not codable concepts without codable children: Secondary | ICD-10-CM

## 2010-11-22 DIAGNOSIS — Z309 Encounter for contraceptive management, unspecified: Secondary | ICD-10-CM

## 2010-11-22 DIAGNOSIS — Z3049 Encounter for surveillance of other contraceptives: Secondary | ICD-10-CM

## 2010-11-22 MED ORDER — MEDROXYPROGESTERONE ACETATE 150 MG/ML IM SUSP
150.0000 mg | Freq: Once | INTRAMUSCULAR | Status: AC
  Administered 2010-11-22: 150 mg via INTRAMUSCULAR

## 2010-11-22 NOTE — Progress Notes (Signed)
Addended by: Barbara Cower on: 11/22/2010 04:04 PM   Modules accepted: Orders

## 2010-11-22 NOTE — Progress Notes (Signed)
Addended by: Barbara Cower on: 11/22/2010 03:33 PM   Modules accepted: Orders

## 2010-11-22 NOTE — Progress Notes (Signed)
  Subjective:     Donna Stevens is a 35 y.o. female who presents for a postpartum visit. She is 6 weeks postpartum following a spontaneous vaginal delivery. I have fully reviewed the prenatal and intrapartum course. The delivery was at 40 gestational weeks. Outcome: spontaneous vaginal delivery. Anesthesia: epidural. Postpartum course has been uncomplicated. Baby's course has been uncomplicated. Baby is feeding by bottle - [employer] Good Start. Bleeding staining only. Bowel function is normal. Bladder function is normal. Patient is not sexually active. Contraception method is oral progesterone-only contraceptive and desires Depo Provera injections; has used this in the past.. Postpartum depression screening: negative.  The following portions of the patient's history were reviewed and updated as appropriate: allergies, current medications, past family history, past medical history, past social history, past surgical history and problem list.  Review of Systems A comprehensive review of systems was negative.   Objective:    BP 127/93  Pulse 81  Ht 5\' 7"  (1.702 m)  Wt 220 lb (99.791 kg)  BMI 34.46 kg/m2  LMP 11/13/2010  Breastfeeding? No  General:  alert and no distress   Breasts:  inspection negative, no nipple discharge or bleeding, no masses or nodularity palpable  Lungs: clear to auscultation bilaterally  Heart:  regular rate and rhythm  Abdomen: soft, non-tender; bowel sounds normal; no masses,  no organomegaly   Vulva:  normal  Vagina: normal vagina  Cervix:  no bleeding following Pap  Corpus: normal size, contour, position, consistency, mobility, non-tender  Adnexa:  normal adnexa and no mass, fullness, tenderness  Rectal Exam: Not performed.        Assessment:    Normal postpartum exam. Pap smear done at today's visit.   Plan:    1. Contraception: Depo-Provera injections 2. Routine preventative health maintenance reviewed 3. Follow up as needed.

## 2010-11-22 NOTE — Patient Instructions (Signed)

## 2011-01-03 NOTE — Telephone Encounter (Signed)
Preadmission screen  

## 2011-02-21 ENCOUNTER — Encounter: Payer: Self-pay | Admitting: Obstetrics & Gynecology

## 2011-02-21 ENCOUNTER — Ambulatory Visit (INDEPENDENT_AMBULATORY_CARE_PROVIDER_SITE_OTHER): Payer: 59 | Admitting: Obstetrics & Gynecology

## 2011-02-21 VITALS — BP 133/90 | HR 100 | Ht 67.0 in | Wt 233.0 lb

## 2011-02-21 DIAGNOSIS — IMO0001 Reserved for inherently not codable concepts without codable children: Secondary | ICD-10-CM

## 2011-02-21 DIAGNOSIS — Z309 Encounter for contraceptive management, unspecified: Secondary | ICD-10-CM

## 2011-02-21 MED ORDER — NORETHINDRONE-MESTRANOL 1-50 MG-MCG PO TABS: 1.0000 | ORAL_TABLET | Freq: Every day | ORAL | Status: DC

## 2011-02-21 NOTE — Progress Notes (Signed)
  Subjective:    Patient ID: Donna Stevens, female    DOB: Oct 04, 1975, 36 y.o.   MRN: [id number]  HPI  She is here to discuss birth control options. She was previously on [employer] and liked it. She has gained weight, has headaches with the depo provera shot. She does not want any more depo provera.  Review of Systems Denies depression    Objective:   Physical Exam        Assessment & Plan:  Birth control- she can restart Lo Estrin. I will e-prescribe it and she will get a BP check in 1 week. She will use condoms for back up for 2 weeks.

## 2011-11-15 ENCOUNTER — Ambulatory Visit (INDEPENDENT_AMBULATORY_CARE_PROVIDER_SITE_OTHER): Payer: 59 | Admitting: Obstetrics & Gynecology

## 2011-11-15 ENCOUNTER — Encounter: Payer: Self-pay | Admitting: Obstetrics & Gynecology

## 2011-11-15 VITALS — BP 138/99 | HR 98 | Ht 66.0 in | Wt 223.0 lb

## 2011-11-15 DIAGNOSIS — F17200 Nicotine dependence, unspecified, uncomplicated: Secondary | ICD-10-CM

## 2011-11-15 DIAGNOSIS — Z01419 Encounter for gynecological examination (general) (routine) without abnormal findings: Secondary | ICD-10-CM

## 2011-11-15 DIAGNOSIS — Z124 Encounter for screening for malignant neoplasm of cervix: Secondary | ICD-10-CM

## 2011-11-15 DIAGNOSIS — Z1151 Encounter for screening for human papillomavirus (HPV): Secondary | ICD-10-CM

## 2011-11-15 DIAGNOSIS — Z0000 Encounter for general adult medical examination without abnormal findings: Secondary | ICD-10-CM

## 2011-11-15 MED ORDER — VARENICLINE TARTRATE 0.5 MG PO TABS: 0.5000 mg | ORAL_TABLET | Freq: Two times a day (BID) | ORAL | Status: DC

## 2011-11-15 MED ORDER — NORETHINDRONE ACET-ETHINYL EST 1.5-30 MG-MCG PO TABS: 1.0000 | ORAL_TABLET | Freq: Every day | ORAL | Status: DC

## 2011-11-15 NOTE — Progress Notes (Signed)
Subjective:    Donna Stevens is a 36 y.o. female who presents for an annual exam. The patient has no complaints today. She is requesting Chantix. The patient is sexually active. GYN screening history: last pap: was normal. The patient wears seatbelts: yes. The patient participates in regular exercise: no. Has the patient ever been transfused or tattooed?: yes. The patient reports that there is not domestic violence in her life.   Menstrual History: OB History    Grav Para Term Preterm Abortions TAB SAB Ect Mult Living   2 2 2  0 0 0 0 0 0 2      Menarche age: 51 Patient's last menstrual period was 11/01/2011.    The following portions of the patient's history were reviewed and updated as appropriate: allergies, current medications, past family history, past medical history, past social history, past surgical history and problem list.  Review of Systems A comprehensive review of systems was negative. She denies dyspareunia, declines a flu shot. She breastfed for only 3 weeks   Objective:    BP 138/99  Pulse 98  Ht 5\' 6"  (1.676 m)  Wt 223 lb (101.152 kg)  BMI 35.99 kg/m2  LMP 11/01/2011  General Appearance:    Alert, cooperative, no distress, appears stated age  Head:    Normocephalic, without obvious abnormality, atraumatic  Eyes:    PERRL, conjunctiva/corneas clear, EOM's intact, fundi    benign, both eyes  Ears:    Normal TM's and external ear canals, both ears  Nose:   Nares normal, septum midline, mucosa normal, no drainage    or sinus tenderness  Throat:   Lips, mucosa, and tongue normal; teeth and gums normal  Neck:   Supple, symmetrical, trachea midline, no adenopathy;    thyroid:  no enlargement/tenderness/nodules; no carotid   bruit or JVD  Back:     Symmetric, no curvature, ROM normal, no CVA tenderness  Lungs:     Clear to auscultation bilaterally, respirations unlabored  Chest Wall:    No tenderness or deformity   Heart:    Regular rate and rhythm, S1 and S2  normal, no murmur, rub   or gallop  Breast Exam:    No tenderness, masses, or nipple abnormality  Abdomen:     Soft, non-tender, bowel sounds active all four quadrants,    no masses, no organomegaly, obese  Genitalia:    Normal female without lesion, discharge or tenderness, NSSA, NT, no adnexal tenderness or masses     Extremities:   Extremities normal, atraumatic, no cyanosis or edema  Pulses:   2+ and symmetric all extremities  Skin:   Skin color, texture, turgor normal, no rashes or lesions  Lymph nodes:   Cervical, supraclavicular, and axillary nodes normal  Neurologic:   CNII-XII intact, normal strength, sensation and reflexes    throughout  .    Assessment:    Healthy female exam.    Plan:     Thin prep Pap smear.  Chantix Refill OCPs RTC 1 month/quit Chantix if sx of depression show up

## 2011-11-15 NOTE — Addendum Note (Signed)
Addended by: Barbara Cower on: 11/15/2011 03:55 PM   Modules accepted: Orders

## 2011-12-12 ENCOUNTER — Ambulatory Visit: Payer: 59 | Admitting: Obstetrics and Gynecology

## 2011-12-12 DIAGNOSIS — F17200 Nicotine dependence, unspecified, uncomplicated: Secondary | ICD-10-CM

## 2012-05-31 ENCOUNTER — Telehealth: Payer: Self-pay | Admitting: *Deleted

## 2012-05-31 DIAGNOSIS — IMO0001 Reserved for inherently not codable concepts without codable children: Secondary | ICD-10-CM

## 2012-05-31 MED ORDER — NORETHINDRONE ACET-ETHINYL EST 1.5-30 MG-MCG PO TABS: 1.0000 | ORAL_TABLET | Freq: Every day | ORAL | Status: DC

## 2012-05-31 NOTE — Telephone Encounter (Signed)
Patient is requesting a refill of her birth control pills. 

## 2013-11-25 ENCOUNTER — Encounter: Payer: Self-pay | Admitting: Obstetrics & Gynecology

## 2014-01-24 NOTE — L&D Delivery Note (Cosign Needed)
   Delivery Note At 6:03 AM a viable female was delivered via Vaginal, Spontaneous Delivery (Presentation: Left Occiput Anterior).  APGAR: 6, 8; weight 7 lb 10.9 oz (3485 g).   Placenta status: Intact, Spontaneous.  Cord: 3 vessels with the following complications: nuchal x 1 reduced prior to del .    Anesthesia: Epidural  Episiotomy: None Lacerations: None Est. Blood Loss (mL): 100  Mom to postpartum.  Baby to Couplet care / Skin to Skin.  Cam HaiSHAW, Antonia Jicha CNM 11/15/2014, 6:20 AM

## 2014-07-03 ENCOUNTER — Encounter: Payer: Self-pay | Admitting: Family Medicine

## 2014-07-03 ENCOUNTER — Ambulatory Visit (INDEPENDENT_AMBULATORY_CARE_PROVIDER_SITE_OTHER): Payer: Self-pay | Admitting: Family Medicine

## 2014-07-03 VITALS — BP 130/71 | HR 96 | Temp 98.4°F | Wt 242.1 lb

## 2014-07-03 DIAGNOSIS — Z1151 Encounter for screening for human papillomavirus (HPV): Secondary | ICD-10-CM

## 2014-07-03 DIAGNOSIS — Z3492 Encounter for supervision of normal pregnancy, unspecified, second trimester: Secondary | ICD-10-CM

## 2014-07-03 DIAGNOSIS — Z3482 Encounter for supervision of other normal pregnancy, second trimester: Secondary | ICD-10-CM

## 2014-07-03 DIAGNOSIS — Z113 Encounter for screening for infections with a predominantly sexual mode of transmission: Secondary | ICD-10-CM

## 2014-07-03 DIAGNOSIS — Z118 Encounter for screening for other infectious and parasitic diseases: Secondary | ICD-10-CM

## 2014-07-03 DIAGNOSIS — O09529 Supervision of elderly multigravida, unspecified trimester: Secondary | ICD-10-CM

## 2014-07-03 DIAGNOSIS — Z124 Encounter for screening for malignant neoplasm of cervix: Secondary | ICD-10-CM

## 2014-07-03 NOTE — Progress Notes (Signed)
Here to start prenatal care. States used to have regular periods until stopped taking the pill in July. Since then has been irregular with 2 periods in December and LMP around 03/31/14. Given prenatal education booklets.

## 2014-07-03 NOTE — Progress Notes (Signed)
   Subjective:    Donna Stevens is a [id number] Unknown being seen today for her first obstetrical visit.  Her obstetrical history is significant for advanced maternal age and excessive weight gain with last pregnancy. Patient does not intend to breast feed. Pregnancy history fully reviewed.  Quit smoking 3 weeks ago.   Patient reports backache, headache, nausea, no cramping, no leaking and no vomiting, no heartburn.  Filed Vitals:   07/03/14 1353  BP: 130/71  Pulse: 96  Temp: 98.4 F (36.9 C)  Weight: 242 lb 1.6 oz (109.816 kg)    HISTORY: OB History  Gravida Para Term Preterm AB SAB TAB Ectopic Multiple Living  4 2 2  0 1 1 0 0 0 2    # Outcome Date GA Lbr Len/2nd Weight Sex Delivery Anes PTL Lv  4 Current           3 Term 10/10/10 [redacted]w[redacted]d 13:30 / 00:56 8 lb 15.2 oz (4.06 kg) M VBAC EPI  Y     Comments: None  2 SAB 04/2009          1 Term 1996 [redacted]w[redacted]d   M CS-Unspec        Past Medical History  Diagnosis Date  . Previous cesarean delivery, antepartum condition or complication   . Maternal obesity syndrome, antepartum    Past Surgical History  Procedure Laterality Date  . Cesarean section  1996  . Neuroplasty / transposition median nerve at carpal tunnel  2006   Family History  Problem Relation Age of Onset  . Hepatitis Mother      Exam    Uterus:    FH 53, exam consistent with 19w uterus  Pelvic Exam:    Perineum: No Hemorrhoids   Vulva: normal   Vagina:  normal mucosa, normal discharge   Cervix: no bleeding following Pap and no cervical motion tenderness   Adnexa: normal adnexa   Bony Pelvis: average  System: Breast:  normal appearance, no masses or tenderness   Skin: normal coloration and turgor, no rashes    Neurologic: oriented, normal, negative   Extremities: normal strength, tone, and muscle mass   HEENT PERRLA   Mouth/Teeth mucous membranes moist, pharynx normal without lesions   Neck supple and no masses   Cardiovascular: regular rate and rhythm    Respiratory:  appears well, vitals normal, no respiratory distress, acyanotic, normal RR, ear and throat exam is normal, neck free of mass or lymphadenopathy, chest clear, no wheezing, crepitations, rhonchi, normal symmetric air entry   Abdomen: soft, non-tender; bowel sounds normal; no masses,  no organomegaly   Urinary: urethral meatus normal      Assessment:    Pregnancy: [id number] Patient Active Problem List   Diagnosis Date Noted  . Antepartum multigravida of advanced maternal age 20/13/2016        Plan:     Initial labs drawn. Prenatal vitamins. Problem list reviewed and updated. Genetic Screening discussed Integrated Screen: will refer to MFM for genetic counseling,  once dating confirmed vs Quad screen if able  Ultrasound discussed; fetal survey: requested.  Follow up in 2 weeks. 50% of 25 min visit spent on counseling and coordination of care.  1h gtt: early done today 2/2 weight Hx of excessive weight gain: meet with nutritionist when able  Donna Stevens Donna Stevens 07/07/2014

## 2014-07-04 LAB — PRESCRIPTION MONITORING PROFILE (19 PANEL)
Amphetamine/Meth: NEGATIVE ng/mL
BARBITURATE SCREEN, URINE: NEGATIVE ng/mL
Benzodiazepine Screen, Urine: NEGATIVE ng/mL
Buprenorphine, Urine: NEGATIVE ng/mL
CANNABINOID SCRN UR: NEGATIVE ng/mL
CARISOPRODOL, URINE: NEGATIVE ng/mL
CREATININE, URINE: 212.52 mg/dL (ref >20.0)
Cocaine Metabolites: NEGATIVE ng/mL
FENTANYL URINE: NEGATIVE ng/mL
MDMA URINE: NEGATIVE ng/mL
MEPERIDINE UR: NEGATIVE ng/mL
METHADONE SCREEN, URINE: NEGATIVE ng/mL
METHAQUALONE SCREEN (URINE): NEGATIVE ng/mL
Nitrites, Initial: NEGATIVE ug/mL
OPIATE SCREEN, URINE: NEGATIVE ng/mL
Oxycodone Screen, Ur: NEGATIVE ng/mL
Phencyclidine, Ur: NEGATIVE ng/mL
Propoxyphene: NEGATIVE ng/mL
TAPENTADOLUR: NEGATIVE ng/mL
Tramadol Scrn, Ur: NEGATIVE ng/mL
Zolpidem, Urine: NEGATIVE ng/mL
pH, Initial: 6.2 pH (ref 4.5–8.9)

## 2014-07-04 LAB — POCT URINALYSIS DIP (DEVICE)
GLUCOSE, UA: NEGATIVE mg/dL
KETONES UR: 15 mg/dL — AB
Leukocytes, UA: NEGATIVE
NITRITE: NEGATIVE
PH: 6 (ref 5.0–8.0)
PROTEIN: NEGATIVE mg/dL
SPECIFIC GRAVITY, URINE: 1.025 (ref 1.005–1.030)
UROBILINOGEN UA: 1 mg/dL (ref 0.0–1.0)

## 2014-07-04 LAB — PRENATAL PROFILE (SOLSTAS)
ANTIBODY SCREEN: NEGATIVE
BASOS PCT: 0 % (ref 0–1)
Basophils Absolute: 0 10*3/uL (ref 0.0–0.1)
EOS ABS: 0.1 10*3/uL (ref 0.0–0.7)
EOS PCT: 1 % (ref 0–5)
HEMATOCRIT: 31.7 % — AB (ref 36.0–46.0)
HEMOGLOBIN: 10.5 g/dL — AB (ref 12.0–15.0)
HIV 1&2 Ab, 4th Generation: NONREACTIVE
Hepatitis B Surface Ag: NEGATIVE
LYMPHS ABS: 1.3 10*3/uL (ref 0.7–4.0)
Lymphocytes Relative: 17 % (ref 12–46)
MCH: 31.8 pg (ref 26.0–34.0)
MCHC: 33.1 g/dL (ref 30.0–36.0)
MCV: 96.1 fL (ref 78.0–100.0)
MONO ABS: 0.5 10*3/uL (ref 0.1–1.0)
MONOS PCT: 6 % (ref 3–12)
MPV: 11.3 fL (ref 8.6–12.4)
NEUTROS ABS: 5.8 10*3/uL (ref 1.7–7.7)
Neutrophils Relative %: 76 % (ref 43–77)
PLATELETS: 235 10*3/uL (ref 150–400)
RBC: 3.3 MIL/uL — AB (ref 3.87–5.11)
RDW: 13.4 % (ref 11.5–15.5)
RH TYPE: POSITIVE
Rubella: 2.51 Index — ABNORMAL HIGH (ref <0.90)
WBC: 7.6 10*3/uL (ref 4.0–10.5)

## 2014-07-04 LAB — CYTOLOGY - PAP

## 2014-07-04 LAB — GLUCOSE TOLERANCE, 1 HOUR (50G) W/O FASTING: GLUCOSE 1 HOUR GTT: 120 mg/dL (ref 70–140)

## 2014-07-04 LAB — CULTURE, OB URINE
COLONY COUNT: NO GROWTH
ORGANISM ID, BACTERIA: NO GROWTH

## 2014-07-07 DIAGNOSIS — O09529 Supervision of elderly multigravida, unspecified trimester: Secondary | ICD-10-CM | POA: Insufficient documentation

## 2014-07-08 ENCOUNTER — Ambulatory Visit (HOSPITAL_COMMUNITY)
Admission: RE | Admit: 2014-07-08 | Discharge: 2014-07-08 | Disposition: A | Discharge status: Home / Self Care | Payer: Self-pay | Source: Ambulatory Visit | Attending: Family Medicine | Admitting: Family Medicine

## 2014-07-08 ENCOUNTER — Other Ambulatory Visit: Payer: Self-pay | Admitting: Family Medicine

## 2014-07-08 DIAGNOSIS — Z3492 Encounter for supervision of normal pregnancy, unspecified, second trimester: Secondary | ICD-10-CM

## 2014-07-08 DIAGNOSIS — Z3490 Encounter for supervision of normal pregnancy, unspecified, unspecified trimester: Secondary | ICD-10-CM | POA: Insufficient documentation

## 2014-07-08 DIAGNOSIS — O09529 Supervision of elderly multigravida, unspecified trimester: Secondary | ICD-10-CM | POA: Insufficient documentation

## 2014-07-08 DIAGNOSIS — Z3482 Encounter for supervision of other normal pregnancy, second trimester: Secondary | ICD-10-CM | POA: Insufficient documentation

## 2014-07-08 DIAGNOSIS — Z3A21 21 weeks gestation of pregnancy: Secondary | ICD-10-CM | POA: Insufficient documentation

## 2014-07-10 ENCOUNTER — Encounter: Payer: Self-pay | Admitting: Family Medicine

## 2014-07-10 NOTE — Addendum Note (Signed)
Addended by: Jaynie Collins A on: 07/10/2014 10:36 AM   Modules accepted: Orders

## 2014-07-11 ENCOUNTER — Ambulatory Visit (HOSPITAL_COMMUNITY)
Admission: RE | Admit: 2014-07-11 | Discharge: 2014-07-11 | Disposition: A | Discharge status: Home / Self Care | Payer: Self-pay | Source: Ambulatory Visit | Attending: Family Medicine | Admitting: Family Medicine

## 2014-07-11 ENCOUNTER — Encounter (HOSPITAL_COMMUNITY): Payer: Self-pay

## 2014-07-11 VITALS — BP 153/79 | HR 96 | Wt 245.0 lb

## 2014-07-11 DIAGNOSIS — Z3492 Encounter for supervision of normal pregnancy, unspecified, second trimester: Secondary | ICD-10-CM

## 2014-07-11 DIAGNOSIS — O09522 Supervision of elderly multigravida, second trimester: Secondary | ICD-10-CM | POA: Insufficient documentation

## 2014-07-11 DIAGNOSIS — Z315 Encounter for genetic counseling: Secondary | ICD-10-CM | POA: Insufficient documentation

## 2014-07-11 DIAGNOSIS — O34219 Maternal care for unspecified type scar from previous cesarean delivery: Secondary | ICD-10-CM

## 2014-07-11 DIAGNOSIS — O09529 Supervision of elderly multigravida, unspecified trimester: Secondary | ICD-10-CM

## 2014-07-11 DIAGNOSIS — Z3A22 22 weeks gestation of pregnancy: Secondary | ICD-10-CM | POA: Insufficient documentation

## 2014-07-11 DIAGNOSIS — O99210 Obesity complicating pregnancy, unspecified trimester: Secondary | ICD-10-CM

## 2014-07-11 NOTE — Progress Notes (Signed)
Genetic Counseling  High-Risk Gestation Note  Appointment Date:  07/11/2014 Referred By: Fredirick Lathe, MD Date of Birth:  [birth year]   Pregnancy History: [id number] Estimated Date of Delivery: 11/10/14 Estimated Gestational Age: [redacted]w[redacted]d Attending: Particia Nearing, MD   Ms. Donna Stevens was seen for genetic counseling because of a maternal age of 38 y.o..     In summary:   Discussed Advanced maternal age and associated 1 in 90 risk for fetal aneuploidy  Detailed ultrasound performed today. Visualized fetal anatomy appeared normal  Patient declined all additional screening and testing for fetal aneuploidy (including NIPS and amniocentesis). She stated this information would not be helpful to her prior to delivery and has the potential to cause her additional stress and worry.   She was counseled regarding maternal age and the association with risk for chromosome conditions due to nondisjunction with aging of the ova.   We reviewed chromosomes, nondisjunction, and the associated 1 in 19 risk for fetal aneuploidy related to a maternal age of 39 y.o. at [redacted]w[redacted]d gestation.  She was counseled that the risk for aneuploidy decreases as gestational age increases, accounting for those pregnancies which spontaneously abort.  We specifically discussed Down syndrome (trisomy 13), trisomies 36 and 46, and sex chromosome aneuploidies (47,XXX and 47,XXY) including the common features and prognoses of each.   We reviewed available screening options including noninvasive prenatal screening (NIPS)/cell free DNA (cfDNA) testing and detailed ultrasound.  She was counseled that screening tests are used to modify a patient's a priori risk for aneuploidy, typically based on age. This estimate provides a pregnancy specific risk assessment. We reviewed the benefits and limitations of each option. Specifically, we discussed the conditions for which each test screens, the detection rates, and false positive rates of each.  Detailed ultrasound was performed at the time of today's visit. Visualized fetal anatomy appeared normal. Complete ultrasound results reported separately.   She was also counseled regarding diagnostic testing via amniocentesis. We reviewed the approximate 1 in 300-500 risk for complications for amniocentesis, including spontaneous pregnancy loss.  After consideration of all the options, she declined all additional screening and testing for fetal aneuploidy, including NIPS and amniocentesis. Ms. Donna Stevens stated that she would not alter her pregnancy plans if an underlying chromosome condition was present, and knowing about the presence of one before delivery would likely cause her additional stress.   She understands that screening tests cannot rule out all birth defects or genetic syndromes. The patient was advised of this limitation and states she still does not want additional testing at this time.   Ms. Donna Stevens was provided with written information regarding cystic fibrosis (CF) including the carrier frequency and incidence in the Caucasian and Hispanic populations, the availability of carrier testing and prenatal diagnosis if indicated.  In addition, we discussed that CF is routinely screened for as part of the [employer] newborn screening panel.  She declined CF testing today.   Both family histories were reviewed and found to be contributory for two maternal first cousins once removed to the patient with autism spectrum disorder. She reported that her maternal first cousin's daughter has Asperger syndrome, and the maternal first cousin's son (siblings to each other) has autism spectrum disorder. We discussed that autism is part of the spectrum of conditions referred to as Autistic spectrum disorders (ASD). We discussed that ASDs are among the most common neurodevelopmental disorders, with approximately 1 in 68 children meeting criteria for ASD, according to the Centers for Disease Control.  Approximately 80% of individuals diagnosed are female. There is strong evidence that genetic factors play a critical role in development of ASD. There have been recent advances in identifying specific genetic causes of ASD, however, there are still many individuals for whom the etiology of the ASD is not known. The majority of individuals with ASD (70-80%) have essential autism. There is strong evidence that genetic factors play a critical role in development of ASD. Some individuals with ASDs are found to have causative differences in karyotype analysis, chromosomal microarray analysis, or single genes. These are more likely to be identified in individuals with complex autism spectrum disorders.  We discussed that recurrence risk data are limited  In the case of an identified genetic cause, recurrence risk estimate may change, and in some cases could be up to 50%. The patient is aware that for many individuals with autism spectrum disorders an underlying genetic cause is not identified at this time. In the absence of an identified genetic etiology, prenatal screening or testing would not be available in the current pregnancy for the autism spectrum disorders in the family. Without further information regarding the provided family history, an accurate genetic risk cannot be calculated. Further genetic counseling is warranted if more information is obtained.  Ms. Donna Stevens denied exposure to environmental toxins or chemical agents. She denied the use of alcohol, tobacco or street drugs. She denied significant viral illnesses during the course of her pregnancy. Her medical and surgical histories were noncontributory.   I counseled Ms. Donna Stevens regarding the above risks and available options.  The approximate face-to-face time with the genetic counselor was 35 minutes.  Quinn Plowman, MS,  Certified Genetic Counselor 07/11/2014

## 2014-07-14 DIAGNOSIS — O34219 Maternal care for unspecified type scar from previous cesarean delivery: Secondary | ICD-10-CM | POA: Insufficient documentation

## 2014-07-14 DIAGNOSIS — O99210 Obesity complicating pregnancy, unspecified trimester: Secondary | ICD-10-CM | POA: Insufficient documentation

## 2014-07-17 ENCOUNTER — Ambulatory Visit (INDEPENDENT_AMBULATORY_CARE_PROVIDER_SITE_OTHER): Payer: Self-pay | Admitting: Family Medicine

## 2014-07-17 VITALS — BP 105/65 | HR 96 | Temp 98.2°F | Wt 246.1 lb

## 2014-07-17 DIAGNOSIS — O09529 Supervision of elderly multigravida, unspecified trimester: Secondary | ICD-10-CM

## 2014-07-17 DIAGNOSIS — O09522 Supervision of elderly multigravida, second trimester: Secondary | ICD-10-CM

## 2014-07-17 LAB — POCT URINALYSIS DIP (DEVICE)
Bilirubin Urine: NEGATIVE
Glucose, UA: NEGATIVE mg/dL
KETONES UR: NEGATIVE mg/dL
Nitrite: NEGATIVE
PROTEIN: NEGATIVE mg/dL
Specific Gravity, Urine: 1.025 (ref 1.005–1.030)
Urobilinogen, UA: 0.2 mg/dL (ref 0.0–1.0)
pH: 6 (ref 5.0–8.0)

## 2014-07-17 MED ORDER — PRENATAL 27-0.8 MG PO TABS: 1.0000 | ORAL_TABLET | Freq: Every day | ORAL | Status: AC

## 2014-07-17 NOTE — Progress Notes (Signed)
Subjective:  Donna Stevens is a 39 y.o. [id number] at [redacted]w[redacted]d being seen today for ongoing prenatal care.  Patient reports no complaints.  Contractions: Not present.  Vag. Bleeding: None. Movement: Present. Denies leaking of fluid.   The following portions of the patient's history were reviewed and updated as appropriate: allergies, current medications, past family history, past medical history, past social history, past surgical history and problem list.   Objective:   Filed Vitals:   07/17/14 1539  BP: 105/65  Pulse: 96  Temp: 98.2 F (36.8 C)  Weight: 246 lb 1.6 oz (111.63 kg)    Fetal Status: Fetal Heart Rate (bpm): 128 Fundal Height: 25 cm Movement: Present     General:  Alert, oriented and cooperative. Patient is in no acute distress.  Skin: Skin is warm and dry. No rash noted.   Cardiovascular: Normal heart rate noted  Respiratory: Normal respiratory effort, no problems with respiration noted  Abdomen: Soft, gravid, appropriate for gestational age. Pain/Pressure: Absent     Vaginal: Vag. Bleeding: None.       Cervix: Not evaluated        Extremities: Normal range of motion.  Edema: Trace  Mental Status: Normal mood and affect. Normal behavior. Normal judgment and thought content.   Urinalysis: Urine Protein: Negative Urine Glucose: Negative  Assessment and Plan:  Pregnancy: G1W2993 at [redacted]w[redacted]d  1. Antepartum multigravida of advanced maternal age, second trimester Requests prenatal vitamin with iron, rx given  Excessive weight gain: reviewed again with patient, will meet with nutritionist on Monday  Please refer to After Visit Summary for other counseling recommendations.   Return in about 3 weeks (around 08/07/2014).   Fredirick Lathe, MD

## 2014-08-07 ENCOUNTER — Ambulatory Visit (INDEPENDENT_AMBULATORY_CARE_PROVIDER_SITE_OTHER): Payer: Self-pay | Admitting: Obstetrics and Gynecology

## 2014-08-07 ENCOUNTER — Encounter: Payer: Self-pay | Admitting: Obstetrics and Gynecology

## 2014-08-07 VITALS — BP 127/74 | HR 84 | Wt 245.2 lb

## 2014-08-07 DIAGNOSIS — O09522 Supervision of elderly multigravida, second trimester: Secondary | ICD-10-CM

## 2014-08-07 LAB — POCT URINALYSIS DIP (DEVICE)
Bilirubin Urine: NEGATIVE
GLUCOSE, UA: NEGATIVE mg/dL
Hgb urine dipstick: NEGATIVE
KETONES UR: NEGATIVE mg/dL
NITRITE: NEGATIVE
PH: 6 (ref 5.0–8.0)
PROTEIN: NEGATIVE mg/dL
Specific Gravity, Urine: 1.025 (ref 1.005–1.030)
Urobilinogen, UA: 0.2 mg/dL (ref 0.0–1.0)

## 2014-08-07 NOTE — Progress Notes (Signed)
Subjective:  Chundra A Hardison Katharine LookMartell is a 39 y.o. [id number] at 1834w3d being seen today for ongoing prenatal care.  Patient reports no contractions, no leaking and  UR congestion..  Contractions: Not present.  Vag. Bleeding: None. Movement: Present. Denies leaking of fluid. Early GCT normal.  The following portions of the patient's history were reviewed and updated as appropriate: allergies, current medications, past family history, past medical history, past social history, past surgical history and problem list.   Objective:   Filed Vitals:   08/07/14 1535  BP: 127/74  Pulse: 84  Weight: 245 lb 3.2 oz (111.222 kg)    Fetal Status:     Movement: Present     General:  Alert, oriented and cooperative. Patient is in no acute distress.  Skin: Skin is warm and dry. No rash noted.   Cardiovascular: Normal heart rate noted  Respiratory: Normal respiratory effort, no problems with respiration noted  Abdomen: Soft, gravid, appropriate for gestational age. Pain/Pressure: Absent     Vaginal: Vag. Bleeding: None.       Cervix: Not evaluated        Extremities: Normal range of motion.  Edema: Trace  Mental Status: Normal mood and affect. Normal behavior. Normal judgment and thought content.   Urinalysis:      Assessment and Plan:  Pregnancy: A5W0981G4P2012 at 4040w3d  1. Antepartum multigravida of advanced maternal age, second trimester Plans VBAC Hx LGA (VBAC) Sinus congestion> list of OTC meds and general measures  Preterm labor symptoms and general obstetric precautions including but not limited to vaginal bleeding, contractions, leaking of fluid and fetal movement were reviewed in detail with the patient. Please refer to After Visit Summary for other counseling recommendations.  Return in about 2 weeks (around 08/21/2014). for 1 hr GCT and 4 wks for PNV. Later would like longer interval between PNVs if possible since paying out of pocket.   Danae Orleanseirdre C Naimah Yingst, CNM

## 2014-08-07 NOTE — Progress Notes (Signed)
Pt feels some nasal congestion and dizziness.

## 2014-08-07 NOTE — Patient Instructions (Signed)
Second Trimester of Pregnancy The second trimester is from week 13 through week 28, months 4 through 6. The second trimester is often a time when you feel your best. Your body has also adjusted to being pregnant, and you begin to feel better physically. Usually, morning sickness has lessened or quit completely, you may have more energy, and you may have an increase in appetite. The second trimester is also a time when the fetus is growing rapidly. At the end of the sixth month, the fetus is about 9 inches long and weighs about 1 pounds. You will likely begin to feel the baby move (quickening) between 18 and 20 weeks of the pregnancy. BODY CHANGES Your body goes through many changes during pregnancy. The changes vary from woman to woman.   Your weight will continue to increase. You will notice your lower abdomen bulging out.  You may begin to get stretch marks on your hips, abdomen, and breasts.  You may develop headaches that can be relieved by medicines approved by your health care provider.  You may urinate more often because the fetus is pressing on your bladder.  You may develop or continue to have heartburn as a result of your pregnancy.  You may develop constipation because certain hormones are causing the muscles that push waste through your intestines to slow down.  You may develop hemorrhoids or swollen, bulging veins (varicose veins).  You may have back pain because of the weight gain and pregnancy hormones relaxing your joints between the bones in your pelvis and as a result of a shift in weight and the muscles that support your balance.  Your breasts will continue to grow and be tender.  Your gums may bleed and may be sensitive to brushing and flossing.  Dark spots or blotches (chloasma, mask of pregnancy) may develop on your face. This will likely fade after the baby is born.  A dark line from your belly button to the pubic area (linea nigra) may appear. This will likely fade  after the baby is born.  You may have changes in your hair. These can include thickening of your hair, rapid growth, and changes in texture. Some women also have hair loss during or after pregnancy, or hair that feels dry or thin. Your hair will most likely return to normal after your baby is born. WHAT TO EXPECT AT YOUR PRENATAL VISITS During a routine prenatal visit:  You will be weighed to make sure you and the fetus are growing normally.  Your blood pressure will be taken.  Your abdomen will be measured to track your baby's growth.  The fetal heartbeat will be listened to.  Any test results from the previous visit will be discussed. Your health care provider may ask you:  How you are feeling.  If you are feeling the baby move.  If you have had any abnormal symptoms, such as leaking fluid, bleeding, severe headaches, or abdominal cramping.  If you have any questions. Other tests that may be performed during your second trimester include:  Blood tests that check for:  Low iron levels (anemia).  Gestational diabetes (between 24 and 28 weeks).  Rh antibodies.  Urine tests to check for infections, diabetes, or protein in the urine.  An ultrasound to confirm the proper growth and development of the baby.  An amniocentesis to check for possible genetic problems.  Fetal screens for spina bifida and Down syndrome. HOME CARE INSTRUCTIONS   Avoid all smoking, herbs, alcohol, and unprescribed   drugs. These chemicals affect the formation and growth of the baby.  Follow your health care provider's instructions regarding medicine use. There are medicines that are either safe or unsafe to take during pregnancy.  Exercise only as directed by your health care provider. Experiencing uterine cramps is a good sign to stop exercising.  Continue to eat regular, healthy meals.  Wear a good support bra for breast tenderness.  Do not use hot tubs, steam rooms, or saunas.  Wear your  seat belt at all times when driving.  Avoid raw meat, uncooked cheese, cat litter boxes, and soil used by cats. These carry germs that can cause birth defects in the baby.  Take your prenatal vitamins.  Try taking a stool softener (if your health care provider approves) if you develop constipation. Eat more high-fiber foods, such as fresh vegetables or fruit and whole grains. Drink plenty of fluids to keep your urine clear or pale yellow.  Take warm sitz baths to soothe any pain or discomfort caused by hemorrhoids. Use hemorrhoid cream if your health care provider approves.  If you develop varicose veins, wear support hose. Elevate your feet for 15 minutes, 3-4 times a day. Limit salt in your diet.  Avoid heavy lifting, wear low heel shoes, and practice good posture.  Rest with your legs elevated if you have leg cramps or low back pain.  Visit your dentist if you have not gone yet during your pregnancy. Use a soft toothbrush to brush your teeth and be gentle when you floss.  A sexual relationship may be continued unless your health care provider directs you otherwise.  Continue to go to all your prenatal visits as directed by your health care provider. SEEK MEDICAL CARE IF:   You have dizziness.  You have mild pelvic cramps, pelvic pressure, or nagging pain in the abdominal area.  You have persistent nausea, vomiting, or diarrhea.  You have a bad smelling vaginal discharge.  You have pain with urination. SEEK IMMEDIATE MEDICAL CARE IF:   You have a fever.  You are leaking fluid from your vagina.  You have spotting or bleeding from your vagina.  You have severe abdominal cramping or pain.  You have rapid weight gain or loss.  You have shortness of breath with chest pain.  You notice sudden or extreme swelling of your face, hands, ankles, feet, or legs.  You have not felt your baby move in over an hour.  You have severe headaches that do not go away with  medicine.  You have vision changes. Document Released: 01/04/2001 Document Revised: 01/15/2013 Document Reviewed: 03/13/2012 Exit[employer] Patient Information 2015 [employer]. This information is not intended to replace advice given to you by your health care provider. Make sure you discuss any questions you have with your health care provider.  

## 2014-08-21 ENCOUNTER — Other Ambulatory Visit: Payer: Self-pay

## 2014-08-21 DIAGNOSIS — O09523 Supervision of elderly multigravida, third trimester: Secondary | ICD-10-CM

## 2014-08-21 LAB — GLUCOSE TOLERANCE, 1 HOUR (50G) W/O FASTING: GLUCOSE 1 HOUR GTT: 131 mg/dL (ref 70–140)

## 2014-09-04 ENCOUNTER — Ambulatory Visit (INDEPENDENT_AMBULATORY_CARE_PROVIDER_SITE_OTHER): Payer: Self-pay | Admitting: Physician Assistant

## 2014-09-04 ENCOUNTER — Encounter: Payer: Self-pay | Admitting: Physician Assistant

## 2014-09-04 VITALS — BP 123/78 | HR 125 | Temp 98.1°F | Wt 242.7 lb

## 2014-09-04 DIAGNOSIS — O09523 Supervision of elderly multigravida, third trimester: Secondary | ICD-10-CM

## 2014-09-04 DIAGNOSIS — Z23 Encounter for immunization: Secondary | ICD-10-CM

## 2014-09-04 LAB — POCT URINALYSIS DIP (DEVICE)
GLUCOSE, UA: NEGATIVE mg/dL
Nitrite: NEGATIVE
Protein, ur: 30 mg/dL — AB
Specific Gravity, Urine: 1.025 (ref 1.005–1.030)
Urobilinogen, UA: 1 mg/dL (ref 0.0–1.0)
pH: 6 (ref 5.0–8.0)

## 2014-09-04 MED ORDER — TETANUS-DIPHTH-ACELL PERTUSSIS 5-2.5-18.5 LF-MCG/0.5 IM SUSP
0.5000 mL | Freq: Once | INTRAMUSCULAR | Status: AC
  Administered 2014-09-04: 0.5 mL via INTRAMUSCULAR

## 2014-09-04 NOTE — Progress Notes (Signed)
Subjective:  Donna Stevens is a 39 y.o. [id number] at [redacted]w[redacted]d being seen today for ongoing prenatal care.  Patient reports no complaints.  Contractions: Irregular.  Vag. Bleeding: None. Movement: Present. Denies leaking of fluid.   The following portions of the patient's history were reviewed and updated as appropriate: allergies, current medications, past family history, past medical history, past social history, past surgical history and problem list.   Objective:   Filed Vitals:   09/04/14 1601  BP: 123/78  Pulse: 125  Temp: 98.1 F (36.7 C)  Weight: 242 lb 11.2 oz (110.088 kg)    Fetal Status: Fetal Heart Rate (bpm): 150   Movement: Present     General:  Alert, oriented and cooperative. Patient is in no acute distress.  Skin: Skin is warm and dry. No rash noted.   Cardiovascular: Normal heart rate noted  Respiratory: Normal respiratory effort, no problems with respiration noted  Abdomen: Soft, gravid, appropriate for gestational age.   Pelvic: Vag. Bleeding: None     Cervical exam deferred        Extremities: Normal range of motion.  Edema: None  Mental Status: Normal mood and affect. Normal behavior. Normal judgment and thought content.   Urinalysis: Urine Protein: 1+ Urine Glucose: Negative  Assessment and Plan:  Pregnancy: [id number] at [redacted]w[redacted]d  1. Antepartum multigravida of advanced maternal age, third trimester Tdap today Preterm labor symptoms and general obstetric precautions including but not limited to vaginal bleeding, contractions, leaking of fluid and fetal movement were reviewed in detail with the patient. Please refer to After Visit Summary for other counseling recommendations.  Return in about 2 weeks (around 09/18/2014).   Bertram Denver, PA-C

## 2014-09-04 NOTE — Patient Instructions (Signed)
Third Trimester of Pregnancy The third trimester is from week 29 through week 42, months 7 through 9. The third trimester is a time when the fetus is growing rapidly. At the end of the ninth month, the fetus is about 20 inches in length and weighs 6-10 pounds.  BODY CHANGES Your body goes through many changes during pregnancy. The changes vary from woman to woman.   Your weight will continue to increase. You can expect to gain 25-35 pounds (11-16 kg) by the end of the pregnancy.  You may begin to get stretch marks on your hips, abdomen, and breasts.  You may urinate more often because the fetus is moving lower into your pelvis and pressing on your bladder.  You may develop or continue to have heartburn as a result of your pregnancy.  You may develop constipation because certain hormones are causing the muscles that push waste through your intestines to slow down.  You may develop hemorrhoids or swollen, bulging veins (varicose veins).  You may have pelvic pain because of the weight gain and pregnancy hormones relaxing your joints between the bones in your pelvis. Backaches may result from overexertion of the muscles supporting your posture.  You may have changes in your hair. These can include thickening of your hair, rapid growth, and changes in texture. Some women also have hair loss during or after pregnancy, or hair that feels dry or thin. Your hair will most likely return to normal after your baby is born.  Your breasts will continue to grow and be tender. A yellow discharge may leak from your breasts called colostrum.  Your belly button may stick out.  You may feel short of breath because of your expanding uterus.  You may notice the fetus "dropping," or moving lower in your abdomen.  You may have a bloody mucus discharge. This usually occurs a few days to a week before labor begins.  Your cervix becomes thin and soft (effaced) near your due date. WHAT TO EXPECT AT YOUR PRENATAL  EXAMS  You will have prenatal exams every 2 weeks until week 36. Then, you will have weekly prenatal exams. During a routine prenatal visit:  You will be weighed to make sure you and the fetus are growing normally.  Your blood pressure is taken.  Your abdomen will be measured to track your baby's growth.  The fetal heartbeat will be listened to.  Any test results from the previous visit will be discussed.  You may have a cervical check near your due date to see if you have effaced. At around 36 weeks, your caregiver will check your cervix. At the same time, your caregiver will also perform a test on the secretions of the vaginal tissue. This test is to determine if a type of bacteria, Group B streptococcus, is present. Your caregiver will explain this further. Your caregiver may ask you:  What your birth plan is.  How you are feeling.  If you are feeling the baby move.  If you have had any abnormal symptoms, such as leaking fluid, bleeding, severe headaches, or abdominal cramping.  If you have any questions. Other tests or screenings that may be performed during your third trimester include:  Blood tests that check for low iron levels (anemia).  Fetal testing to check the health, activity level, and growth of the fetus. Testing is done if you have certain medical conditions or if there are problems during the pregnancy. FALSE LABOR You may feel small, irregular contractions that   eventually go away. These are called Braxton Hicks contractions, or false labor. Contractions may last for hours, days, or even weeks before true labor sets in. If contractions come at regular intervals, intensify, or become painful, it is best to be seen by your caregiver.  SIGNS OF LABOR   Menstrual-like cramps.  Contractions that are 5 minutes apart or less.  Contractions that start on the top of the uterus and spread down to the lower abdomen and back.  A sense of increased pelvic pressure or back  pain.  A watery or bloody mucus discharge that comes from the vagina. If you have any of these signs before the 37th week of pregnancy, call your caregiver right away. You need to go to the hospital to get checked immediately. HOME CARE INSTRUCTIONS   Avoid all smoking, herbs, alcohol, and unprescribed drugs. These chemicals affect the formation and growth of the baby.  Follow your caregiver's instructions regarding medicine use. There are medicines that are either safe or unsafe to take during pregnancy.  Exercise only as directed by your caregiver. Experiencing uterine cramps is a good sign to stop exercising.  Continue to eat regular, healthy meals.  Wear a good support bra for breast tenderness.  Do not use hot tubs, steam rooms, or saunas.  Wear your seat belt at all times when driving.  Avoid raw meat, uncooked cheese, cat litter boxes, and soil used by cats. These carry germs that can cause birth defects in the baby.  Take your prenatal vitamins.  Try taking a stool softener (if your caregiver approves) if you develop constipation. Eat more high-fiber foods, such as fresh vegetables or fruit and whole grains. Drink plenty of fluids to keep your urine clear or pale yellow.  Take warm sitz baths to soothe any pain or discomfort caused by hemorrhoids. Use hemorrhoid cream if your caregiver approves.  If you develop varicose veins, wear support hose. Elevate your feet for 15 minutes, 3-4 times a day. Limit salt in your diet.  Avoid heavy lifting, wear low heal shoes, and practice good posture.  Rest a lot with your legs elevated if you have leg cramps or low back pain.  Visit your dentist if you have not gone during your pregnancy. Use a soft toothbrush to brush your teeth and be gentle when you floss.  A sexual relationship may be continued unless your caregiver directs you otherwise.  Do not travel far distances unless it is absolutely necessary and only with the approval  of your caregiver.  Take prenatal classes to understand, practice, and ask questions about the labor and delivery.  Make a trial run to the hospital.  Pack your hospital bag.  Prepare the baby's nursery.  Continue to go to all your prenatal visits as directed by your caregiver. SEEK MEDICAL CARE IF:  You are unsure if you are in labor or if your water has broken.  You have dizziness.  You have mild pelvic cramps, pelvic pressure, or nagging pain in your abdominal area.  You have persistent nausea, vomiting, or diarrhea.  You have a bad smelling vaginal discharge.  You have pain with urination. SEEK IMMEDIATE MEDICAL CARE IF:   You have a fever.  You are leaking fluid from your vagina.  You have spotting or bleeding from your vagina.  You have severe abdominal cramping or pain.  You have rapid weight loss or gain.  You have shortness of breath with chest pain.  You notice sudden or extreme swelling   of your face, hands, ankles, feet, or legs.  You have not felt your baby move in over an hour.  You have severe headaches that do not go away with medicine.  You have vision changes. Document Released: 01/04/2001 Document Revised: 01/15/2013 Document Reviewed: 03/13/2012 [employer] Patient Information 2015 [employer]. This information is not intended to replace advice given to you by your health care provider. Make sure you discuss any questions you have with your health care provider.  

## 2014-09-18 ENCOUNTER — Ambulatory Visit (INDEPENDENT_AMBULATORY_CARE_PROVIDER_SITE_OTHER): Payer: Self-pay | Admitting: Certified Nurse Midwife

## 2014-09-18 VITALS — BP 116/65 | HR 108 | Temp 98.2°F | Wt 246.0 lb

## 2014-09-18 DIAGNOSIS — Z3483 Encounter for supervision of other normal pregnancy, third trimester: Secondary | ICD-10-CM

## 2014-09-18 LAB — POCT URINALYSIS DIP (DEVICE)
Glucose, UA: NEGATIVE mg/dL
Hgb urine dipstick: NEGATIVE
Ketones, ur: NEGATIVE mg/dL
Nitrite: NEGATIVE
Protein, ur: 30 mg/dL — AB
Specific Gravity, Urine: 1.025 (ref 1.005–1.030)
Urobilinogen, UA: 0.2 mg/dL (ref 0.0–1.0)
pH: 6 (ref 5.0–8.0)

## 2014-09-18 NOTE — Progress Notes (Signed)
  Subjective:  Donna Stevens is a 39 y.o. [id number] at [redacted]w[redacted]d being seen today for ongoing prenatal care.  Patient reports no complaints.  Contractions: Not present.  Vag. Bleeding: None. Movement: Present. Denies leaking of fluid.   The following portions of the patient's history were reviewed and updated as appropriate: allergies, current medications, past family history, past medical history, past social history, past surgical history and problem list.   Objective:   Filed Vitals:   09/18/14 1549  BP: 116/65  Pulse: 108  Temp: 98.2 F (36.8 C)  Weight: 246 lb (111.585 kg)    Fetal Status: Fetal Heart Rate (bpm): 130   Movement: Present     General:  Alert, oriented and cooperative. Patient is in no acute distress.  Skin: Skin is warm and dry. No rash noted.   Cardiovascular: Normal heart rate noted  Respiratory: Normal respiratory effort, no problems with respiration noted  Abdomen: Soft, gravid, appropriate for gestational age. Pain/Pressure: Present     Pelvic: Vag. Bleeding: None     Cervical exam deferred        Extremities: Normal range of motion.  Edema: Trace  Mental Status: Normal mood and affect. Normal behavior. Normal judgment and thought content.   Urinalysis: Urine Protein: 1+ Urine Glucose: Negative  Assessment and Plan:  Pregnancy: [id number] at [redacted]w[redacted]d  There are no diagnoses linked to this encounter. Preterm labor symptoms and general obstetric precautions including but not limited to vaginal bleeding, contractions, leaking of fluid and fetal movement were reviewed in detail with the patient. Please refer to After Visit Summary for other counseling recommendations.  Return in about 2 weeks (around 10/02/2014).   Rhea Pink, CNM

## 2014-09-18 NOTE — Patient Instructions (Signed)
Third Trimester of Pregnancy The third trimester is from week 29 through week 42, months 7 through 9. The third trimester is a time when the fetus is growing rapidly. At the end of the ninth month, the fetus is about 20 inches in length and weighs 6-10 pounds.  BODY CHANGES Your body goes through many changes during pregnancy. The changes vary from woman to woman.   Your weight will continue to increase. You can expect to gain 25-35 pounds (11-16 kg) by the end of the pregnancy.  You may begin to get stretch marks on your hips, abdomen, and breasts.  You may urinate more often because the fetus is moving lower into your pelvis and pressing on your bladder.  You may develop or continue to have heartburn as a result of your pregnancy.  You may develop constipation because certain hormones are causing the muscles that push waste through your intestines to slow down.  You may develop hemorrhoids or swollen, bulging veins (varicose veins).  You may have pelvic pain because of the weight gain and pregnancy hormones relaxing your joints between the bones in your pelvis. Backaches may result from overexertion of the muscles supporting your posture.  You may have changes in your hair. These can include thickening of your hair, rapid growth, and changes in texture. Some women also have hair loss during or after pregnancy, or hair that feels dry or thin. Your hair will most likely return to normal after your baby is born.  Your breasts will continue to grow and be tender. A yellow discharge may leak from your breasts called colostrum.  Your belly button may stick out.  You may feel short of breath because of your expanding uterus.  You may notice the fetus "dropping," or moving lower in your abdomen.  You may have a bloody mucus discharge. This usually occurs a few days to a week before labor begins.  Your cervix becomes thin and soft (effaced) near your due date. WHAT TO EXPECT AT YOUR PRENATAL  EXAMS  You will have prenatal exams every 2 weeks until week 36. Then, you will have weekly prenatal exams. During a routine prenatal visit:  You will be weighed to make sure you and the fetus are growing normally.  Your blood pressure is taken.  Your abdomen will be measured to track your baby's growth.  The fetal heartbeat will be listened to.  Any test results from the previous visit will be discussed.  You may have a cervical check near your due date to see if you have effaced. At around 36 weeks, your caregiver will check your cervix. At the same time, your caregiver will also perform a test on the secretions of the vaginal tissue. This test is to determine if a type of bacteria, Group B streptococcus, is present. Your caregiver will explain this further. Your caregiver may ask you:  What your birth plan is.  How you are feeling.  If you are feeling the baby move.  If you have had any abnormal symptoms, such as leaking fluid, bleeding, severe headaches, or abdominal cramping.  If you have any questions. Other tests or screenings that may be performed during your third trimester include:  Blood tests that check for low iron levels (anemia).  Fetal testing to check the health, activity level, and growth of the fetus. Testing is done if you have certain medical conditions or if there are problems during the pregnancy. FALSE LABOR You may feel small, irregular contractions that   eventually go away. These are called Braxton Hicks contractions, or false labor. Contractions may last for hours, days, or even weeks before true labor sets in. If contractions come at regular intervals, intensify, or become painful, it is best to be seen by your caregiver.  SIGNS OF LABOR   Menstrual-like cramps.  Contractions that are 5 minutes apart or less.  Contractions that start on the top of the uterus and spread down to the lower abdomen and back.  A sense of increased pelvic pressure or back  pain.  A watery or bloody mucus discharge that comes from the vagina. If you have any of these signs before the 37th week of pregnancy, call your caregiver right away. You need to go to the hospital to get checked immediately. HOME CARE INSTRUCTIONS   Avoid all smoking, herbs, alcohol, and unprescribed drugs. These chemicals affect the formation and growth of the baby.  Follow your caregiver's instructions regarding medicine use. There are medicines that are either safe or unsafe to take during pregnancy.  Exercise only as directed by your caregiver. Experiencing uterine cramps is a good sign to stop exercising.  Continue to eat regular, healthy meals.  Wear a good support bra for breast tenderness.  Do not use hot tubs, steam rooms, or saunas.  Wear your seat belt at all times when driving.  Avoid raw meat, uncooked cheese, cat litter boxes, and soil used by cats. These carry germs that can cause birth defects in the baby.  Take your prenatal vitamins.  Try taking a stool softener (if your caregiver approves) if you develop constipation. Eat more high-fiber foods, such as fresh vegetables or fruit and whole grains. Drink plenty of fluids to keep your urine clear or pale yellow.  Take warm sitz baths to soothe any pain or discomfort caused by hemorrhoids. Use hemorrhoid cream if your caregiver approves.  If you develop varicose veins, wear support hose. Elevate your feet for 15 minutes, 3-4 times a day. Limit salt in your diet.  Avoid heavy lifting, wear low heal shoes, and practice good posture.  Rest a lot with your legs elevated if you have leg cramps or low back pain.  Visit your dentist if you have not gone during your pregnancy. Use a soft toothbrush to brush your teeth and be gentle when you floss.  A sexual relationship may be continued unless your caregiver directs you otherwise.  Do not travel far distances unless it is absolutely necessary and only with the approval  of your caregiver.  Take prenatal classes to understand, practice, and ask questions about the labor and delivery.  Make a trial run to the hospital.  Pack your hospital bag.  Prepare the baby's nursery.  Continue to go to all your prenatal visits as directed by your caregiver. SEEK MEDICAL CARE IF:  You are unsure if you are in labor or if your water has broken.  You have dizziness.  You have mild pelvic cramps, pelvic pressure, or nagging pain in your abdominal area.  You have persistent nausea, vomiting, or diarrhea.  You have a bad smelling vaginal discharge.  You have pain with urination. SEEK IMMEDIATE MEDICAL CARE IF:   You have a fever.  You are leaking fluid from your vagina.  You have spotting or bleeding from your vagina.  You have severe abdominal cramping or pain.  You have rapid weight loss or gain.  You have shortness of breath with chest pain.  You notice sudden or extreme swelling   of your face, hands, ankles, feet, or legs.  You have not felt your baby move in over an hour.  You have severe headaches that do not go away with medicine.  You have vision changes. Document Released: 01/04/2001 Document Revised: 01/15/2013 Document Reviewed: 03/13/2012 [employer] Patient Information 2015 [employer]. This information is not intended to replace advice given to you by your health care provider. Make sure you discuss any questions you have with your health care provider.  

## 2014-10-01 ENCOUNTER — Ambulatory Visit (INDEPENDENT_AMBULATORY_CARE_PROVIDER_SITE_OTHER): Payer: Self-pay | Admitting: Obstetrics and Gynecology

## 2014-10-01 VITALS — BP 120/64 | HR 95 | Temp 97.7°F | Wt 248.4 lb

## 2014-10-01 DIAGNOSIS — Z3483 Encounter for supervision of other normal pregnancy, third trimester: Secondary | ICD-10-CM

## 2014-10-01 DIAGNOSIS — O3421 Maternal care for scar from previous cesarean delivery: Secondary | ICD-10-CM

## 2014-10-01 DIAGNOSIS — O34219 Maternal care for unspecified type scar from previous cesarean delivery: Secondary | ICD-10-CM

## 2014-10-01 LAB — POCT URINALYSIS DIP (DEVICE)
Bilirubin Urine: NEGATIVE
Glucose, UA: NEGATIVE mg/dL
HGB URINE DIPSTICK: NEGATIVE
Ketones, ur: NEGATIVE mg/dL
NITRITE: NEGATIVE
PROTEIN: NEGATIVE mg/dL
SPECIFIC GRAVITY, URINE: 1.02 (ref 1.005–1.030)
UROBILINOGEN UA: 1 mg/dL (ref 0.0–1.0)
pH: 6 (ref 5.0–8.0)

## 2014-10-01 NOTE — Progress Notes (Signed)
Breastfeeding tip of the week reviewed Declined Flu vaccine 

## 2014-10-01 NOTE — Progress Notes (Signed)
Subjective:  Donna Stevens is a 39 y.o. [id number] at [redacted]w[redacted]d being seen today for ongoing prenatal care.  Patient reports no complaints.  Contractions: Irritability.  Vag. Bleeding: None. Movement: Present. Denies leaking of fluid.   The following portions of the patient's history were reviewed and updated as appropriate: allergies, current medications, past family history, past medical history, past social history, past surgical history and problem list.   Objective:   Filed Vitals:   10/01/14 1049  BP: 120/64  Pulse: 95  Temp: 97.7 F (36.5 C)  Weight: 248 lb 6.4 oz (112.674 kg)    Fetal Status: Fetal Heart Rate (bpm): 144 Fundal Height: 35 cm Movement: Present     General:  Alert, oriented and cooperative. Patient is in no acute distress.  Skin: Skin is warm and dry. No rash noted.   Cardiovascular: Normal heart rate noted  Respiratory: Normal respiratory effort, no problems with respiration noted  Abdomen: Soft, gravid, appropriate for gestational age. Pain/Pressure: Present     Pelvic: Vag. Bleeding: None     Cervical exam deferred        Extremities: Normal range of motion.  Edema: Trace  Mental Status: Normal mood and affect. Normal behavior. Normal judgment and thought content.   Urinalysis: Urine Protein: Negative Urine Glucose: Negative  Assessment and Plan:  Pregnancy: [id number] at [redacted]w[redacted]d  # Pregnancy - confirmed VBAC, consent signed - continue pnv - f/u 2 weeks - declined flu vaccine  Preterm labor symptoms and general obstetric precautions including but not limited to vaginal bleeding, contractions, leaking of fluid and fetal movement were reviewed in detail with the patient. Please refer to After Visit Summary for other counseling recommendations.  Return in about 2 weeks (around 10/15/2014).   Kathrynn Running, MD

## 2014-10-14 ENCOUNTER — Ambulatory Visit (INDEPENDENT_AMBULATORY_CARE_PROVIDER_SITE_OTHER): Payer: Self-pay | Admitting: Family Medicine

## 2014-10-14 VITALS — BP 125/66 | HR 90 | Temp 97.5°F | Wt 250.0 lb

## 2014-10-14 DIAGNOSIS — E669 Obesity, unspecified: Secondary | ICD-10-CM

## 2014-10-14 DIAGNOSIS — O34219 Maternal care for unspecified type scar from previous cesarean delivery: Secondary | ICD-10-CM

## 2014-10-14 DIAGNOSIS — Z118 Encounter for screening for other infectious and parasitic diseases: Secondary | ICD-10-CM

## 2014-10-14 DIAGNOSIS — Z3493 Encounter for supervision of normal pregnancy, unspecified, third trimester: Secondary | ICD-10-CM

## 2014-10-14 DIAGNOSIS — O09523 Supervision of elderly multigravida, third trimester: Secondary | ICD-10-CM

## 2014-10-14 DIAGNOSIS — O99213 Obesity complicating pregnancy, third trimester: Secondary | ICD-10-CM

## 2014-10-14 DIAGNOSIS — Z113 Encounter for screening for infections with a predominantly sexual mode of transmission: Secondary | ICD-10-CM

## 2014-10-14 DIAGNOSIS — O3421 Maternal care for scar from previous cesarean delivery: Secondary | ICD-10-CM

## 2014-10-14 LAB — POCT URINALYSIS DIP (DEVICE)
Glucose, UA: NEGATIVE mg/dL
Nitrite: NEGATIVE
Protein, ur: NEGATIVE mg/dL
Urobilinogen, UA: 1 mg/dL (ref 0.0–1.0)
pH: 6 (ref 5.0–8.0)

## 2014-10-14 LAB — OB RESULTS CONSOLE GC/CHLAMYDIA: Gonorrhea: NEGATIVE

## 2014-10-14 LAB — OB RESULTS CONSOLE GBS: GBS: POSITIVE

## 2014-10-14 NOTE — Progress Notes (Signed)
Breastfeeding tip of the week reviewed Bilirubin: small, Ketones: trace, Hgb: trace, leukocytes: moderate

## 2014-10-14 NOTE — Progress Notes (Signed)
Subjective:  Donna Stevens is a 39 y.o. [id number] at [redacted]w[redacted]d being seen today for ongoing prenatal care.  Patient reports no complaints.  Contractions: Irritability.  Vag. Bleeding: None. Movement: Present. Denies leaking of fluid.   The following portions of the patient's history were reviewed and updated as appropriate: allergies, current medications, past family history, past medical history, past social history, past surgical history and problem list.   Objective:   Filed Vitals:   10/14/14 1440 10/14/14 1441  BP: 136/80 125/66  Pulse: 90   Temp: 97.5 F (36.4 C)   Weight: 250 lb (113.399 kg)     Fetal Status: Fetal Heart Rate (bpm): 142   Movement: Present     General:  Alert, oriented and cooperative. Patient is in no acute distress.  Skin: Skin is warm and dry. No rash noted.   Cardiovascular: Normal heart rate noted  Respiratory: Normal respiratory effort, no problems with respiration noted  Abdomen: Soft, gravid, appropriate for gestational age. Pain/Pressure: Present     Pelvic: Vag. Bleeding: None     Cervical exam performed        Extremities: Normal range of motion.  Edema: Trace  Mental Status: Normal mood and affect. Normal behavior. Normal judgment and thought content.   Urinalysis: Urine Protein: Negative Urine Glucose: Negative  Assessment and Plan:  Pregnancy: [id number] at [redacted]w[redacted]d  1. Prenatal care in third trimester - Culture, beta strep (group b only) - GC/Chlamydia probe amp ([place])not at [facility]  2. Previous cesarean delivery, antepartum condition or complication -Signed TOLAC 9/7  3. Maternal obesity syndrome, antepartum, third trimester -Appropriate weight gain to date  4. Antepartum multigravida of advanced maternal age, third trimester  Preterm labor symptoms and general obstetric precautions including but not limited to vaginal bleeding, contractions, leaking of fluid and fetal movement were reviewed in detail with the patient. Please  refer to After Visit Summary for other counseling recommendations.  Return in about 1 week (around 10/21/2014) for Routine prenatal care.   Federico Flake, MD

## 2014-10-14 NOTE — Patient Instructions (Signed)
[email or web address]  Third Trimester of Pregnancy The third trimester is from week 29 through week 42, months 7 through 9. The third trimester is a time when the fetus is growing rapidly. At the end of the ninth month, the fetus is about 20 inches in length and weighs 6-10 pounds.  BODY CHANGES Your body goes through many changes during pregnancy. The changes vary from woman to woman.   Your weight will continue to increase. You can expect to gain 25-35 pounds (11-16 kg) by the end of the pregnancy.  You may begin to get stretch marks on your hips, abdomen, and breasts.  You may urinate more often because the fetus is moving lower into your pelvis and pressing on your bladder.  You may develop or continue to have heartburn as a result of your pregnancy.  You may develop constipation because certain hormones are causing the muscles that push waste through your intestines to slow down.  You may develop hemorrhoids or swollen, bulging veins (varicose veins).  You may have pelvic pain because of the weight gain and pregnancy hormones relaxing your joints between the bones in your pelvis. Backaches may result from overexertion of the muscles supporting your posture.  You may have changes in your hair. These can include thickening of your hair, rapid growth, and changes in texture. Some women also have hair loss during or after pregnancy, or hair that feels dry or thin. Your hair will most likely return to normal after your baby is born.  Your breasts will continue to grow and be tender. A yellow discharge may leak from your breasts called colostrum.  Your belly button may stick out.  You may feel short of breath because of your expanding uterus.  You may notice the fetus "dropping," or moving lower in your abdomen.  You may have a bloody mucus discharge. This usually occurs a few days to a week before labor begins.  Your cervix becomes thin and soft (effaced) near your  due date. WHAT TO EXPECT AT YOUR PRENATAL EXAMS  You will have prenatal exams every 2 weeks until week 36. Then, you will have weekly prenatal exams. During a routine prenatal visit:  You will be weighed to make sure you and the fetus are growing normally.  Your blood pressure is taken.  Your abdomen will be measured to track your baby's growth.  The fetal heartbeat will be listened to.  Any test results from the previous visit will be discussed.  You may have a cervical check near your due date to see if you have effaced. At around 36 weeks, your caregiver will check your cervix. At the same time, your caregiver will also perform a test on the secretions of the vaginal tissue. This test is to determine if a type of bacteria, Group B streptococcus, is present. Your caregiver will explain this further. Your caregiver may ask you:  What your birth plan is.  How you are feeling.  If you are feeling the baby move.  If you have had any abnormal symptoms, such as leaking fluid, bleeding, severe headaches, or abdominal cramping.  If you have any questions. Other tests or screenings that may be performed during your third trimester include:  Blood tests that check for low iron levels (anemia).  Fetal testing to check the health, activity level, and growth of the fetus. Testing is done if you have certain medical conditions or if there are problems during the pregnancy. FALSE LABOR You may feel small, irregular  contractions that eventually go away. These are called Braxton Hicks contractions, or false labor. Contractions may last for hours, days, or even weeks before true labor sets in. If contractions come at regular intervals, intensify, or become painful, it is best to be seen by your caregiver.  SIGNS OF LABOR   Menstrual-like cramps.  Contractions that are 5 minutes apart or less.  Contractions that start on the top of the uterus and spread down to the lower abdomen and back.  A  sense of increased pelvic pressure or back pain.  A watery or bloody mucus discharge that comes from the vagina. If you have any of these signs before the 37th week of pregnancy, call your caregiver right away. You need to go to the hospital to get checked immediately. HOME CARE INSTRUCTIONS   Avoid all smoking, herbs, alcohol, and unprescribed drugs. These chemicals affect the formation and growth of the baby.  Follow your caregiver's instructions regarding medicine use. There are medicines that are either safe or unsafe to take during pregnancy.  Exercise only as directed by your caregiver. Experiencing uterine cramps is a good sign to stop exercising.  Continue to eat regular, healthy meals.  Wear a good support bra for breast tenderness.  Do not use hot tubs, steam rooms, or saunas.  Wear your seat belt at all times when driving.  Avoid raw meat, uncooked cheese, cat litter boxes, and soil used by cats. These carry germs that can cause birth defects in the baby.  Take your prenatal vitamins.  Try taking a stool softener (if your caregiver approves) if you develop constipation. Eat more high-fiber foods, such as fresh vegetables or fruit and whole grains. Drink plenty of fluids to keep your urine clear or pale yellow.  Take warm sitz baths to soothe any pain or discomfort caused by hemorrhoids. Use hemorrhoid cream if your caregiver approves.  If you develop varicose veins, wear support hose. Elevate your feet for 15 minutes, 3-4 times a day. Limit salt in your diet.  Avoid heavy lifting, wear low heal shoes, and practice good posture.  Rest a lot with your legs elevated if you have leg cramps or low back pain.  Visit your dentist if you have not gone during your pregnancy. Use a soft toothbrush to brush your teeth and be gentle when you floss.  A sexual relationship may be continued unless your caregiver directs you otherwise.  Do not travel far distances unless it is  absolutely necessary and only with the approval of your caregiver.  Take prenatal classes to understand, practice, and ask questions about the labor and delivery.  Make a trial run to the hospital.  Pack your hospital bag.  Prepare the baby's nursery.  Continue to go to all your prenatal visits as directed by your caregiver. SEEK MEDICAL CARE IF:  You are unsure if you are in labor or if your water has broken.  You have dizziness.  You have mild pelvic cramps, pelvic pressure, or nagging pain in your abdominal area.  You have persistent nausea, vomiting, or diarrhea.  You have a bad smelling vaginal discharge.  You have pain with urination. SEEK IMMEDIATE MEDICAL CARE IF:   You have a fever.  You are leaking fluid from your vagina.  You have spotting or bleeding from your vagina.  You have severe abdominal cramping or pain.  You have rapid weight loss or gain.  You have shortness of breath with chest pain.  You notice sudden or  extreme swelling of your face, hands, ankles, feet, or legs.  You have not felt your baby move in over an hour.  You have severe headaches that do not go away with medicine.  You have vision changes. Document Released: 01/04/2001 Document Revised: 01/15/2013 Document Reviewed: 03/13/2012 [facility] Patient Information 2015 [place], [place]. This information is not intended to replace advice given to you by your health care provider. Make sure you discuss any questions you have with your health care provider.

## 2014-10-15 LAB — GC/CHLAMYDIA PROBE AMP (~~LOC~~) NOT AT ARMC
CHLAMYDIA, DNA PROBE: NEGATIVE
Neisseria Gonorrhea: NEGATIVE

## 2014-10-17 ENCOUNTER — Encounter: Payer: Self-pay | Admitting: Family Medicine

## 2014-10-17 DIAGNOSIS — O99820 Streptococcus B carrier state complicating pregnancy: Secondary | ICD-10-CM | POA: Insufficient documentation

## 2014-10-17 LAB — CULTURE, BETA STREP (GROUP B ONLY)

## 2014-10-21 ENCOUNTER — Ambulatory Visit (INDEPENDENT_AMBULATORY_CARE_PROVIDER_SITE_OTHER): Payer: Self-pay | Admitting: Family Medicine

## 2014-10-21 VITALS — BP 125/77 | HR 96 | Temp 97.6°F | Wt 249.8 lb

## 2014-10-21 DIAGNOSIS — E669 Obesity, unspecified: Secondary | ICD-10-CM

## 2014-10-21 DIAGNOSIS — O99820 Streptococcus B carrier state complicating pregnancy: Secondary | ICD-10-CM

## 2014-10-21 DIAGNOSIS — O99213 Obesity complicating pregnancy, third trimester: Secondary | ICD-10-CM

## 2014-10-21 DIAGNOSIS — Z22330 Carrier of Group B streptococcus: Secondary | ICD-10-CM

## 2014-10-21 DIAGNOSIS — O09523 Supervision of elderly multigravida, third trimester: Secondary | ICD-10-CM

## 2014-10-21 DIAGNOSIS — O3421 Maternal care for scar from previous cesarean delivery: Secondary | ICD-10-CM

## 2014-10-21 DIAGNOSIS — O34219 Maternal care for unspecified type scar from previous cesarean delivery: Secondary | ICD-10-CM

## 2014-10-21 LAB — POCT URINALYSIS DIP (DEVICE)
Bilirubin Urine: NEGATIVE
Glucose, UA: NEGATIVE mg/dL
HGB URINE DIPSTICK: NEGATIVE
Ketones, ur: NEGATIVE mg/dL
Nitrite: NEGATIVE
PH: 7 (ref 5.0–8.0)
Protein, ur: NEGATIVE mg/dL
SPECIFIC GRAVITY, URINE: 1.02 (ref 1.005–1.030)
Urobilinogen, UA: 2 mg/dL — ABNORMAL HIGH (ref 0.0–1.0)

## 2014-10-21 NOTE — Progress Notes (Signed)
Reviewed tip of week with patient  

## 2014-10-21 NOTE — Patient Instructions (Signed)
Third Trimester of Pregnancy The third trimester is from week 29 through week 42, months 7 through 9. The third trimester is a time when the fetus is growing rapidly. At the end of the ninth month, the fetus is about 20 inches in length and weighs 6-10 pounds.  BODY CHANGES Your body goes through many changes during pregnancy. The changes vary from woman to woman.   Your weight will continue to increase. You can expect to gain 25-35 pounds (11-16 kg) by the end of the pregnancy.  You may begin to get stretch marks on your hips, abdomen, and breasts.  You may urinate more often because the fetus is moving lower into your pelvis and pressing on your bladder.  You may develop or continue to have heartburn as a result of your pregnancy.  You may develop constipation because certain hormones are causing the muscles that push waste through your intestines to slow down.  You may develop hemorrhoids or swollen, bulging veins (varicose veins).  You may have pelvic pain because of the weight gain and pregnancy hormones relaxing your joints between the bones in your pelvis. Backaches may result from overexertion of the muscles supporting your posture.  You may have changes in your hair. These can include thickening of your hair, rapid growth, and changes in texture. Some women also have hair loss during or after pregnancy, or hair that feels dry or thin. Your hair will most likely return to normal after your baby is born.  Your breasts will continue to grow and be tender. A yellow discharge may leak from your breasts called colostrum.  Your belly button may stick out.  You may feel short of breath because of your expanding uterus.  You may notice the fetus "dropping," or moving lower in your abdomen.  You may have a bloody mucus discharge. This usually occurs a few days to a week before labor begins.  Your cervix becomes thin and soft (effaced) near your due date. WHAT TO EXPECT AT YOUR PRENATAL  EXAMS  You will have prenatal exams every 2 weeks until week 36. Then, you will have weekly prenatal exams. During a routine prenatal visit:  You will be weighed to make sure you and the fetus are growing normally.  Your blood pressure is taken.  Your abdomen will be measured to track your baby's growth.  The fetal heartbeat will be listened to.  Any test results from the previous visit will be discussed.  You may have a cervical check near your due date to see if you have effaced. At around 36 weeks, your caregiver will check your cervix. At the same time, your caregiver will also perform a test on the secretions of the vaginal tissue. This test is to determine if a type of bacteria, Group B streptococcus, is present. Your caregiver will explain this further. Your caregiver may ask you:  What your birth plan is.  How you are feeling.  If you are feeling the baby move.  If you have had any abnormal symptoms, such as leaking fluid, bleeding, severe headaches, or abdominal cramping.  If you have any questions. Other tests or screenings that may be performed during your third trimester include:  Blood tests that check for low iron levels (anemia).  Fetal testing to check the health, activity level, and growth of the fetus. Testing is done if you have certain medical conditions or if there are problems during the pregnancy. FALSE LABOR You may feel small, irregular contractions that   eventually go away. These are called Braxton Hicks contractions, or false labor. Contractions may last for hours, days, or even weeks before true labor sets in. If contractions come at regular intervals, intensify, or become painful, it is best to be seen by your caregiver.  SIGNS OF LABOR   Menstrual-like cramps.  Contractions that are 5 minutes apart or less.  Contractions that start on the top of the uterus and spread down to the lower abdomen and back.  A sense of increased pelvic pressure or back  pain.  A watery or bloody mucus discharge that comes from the vagina. If you have any of these signs before the 37th week of pregnancy, call your caregiver right away. You need to go to the hospital to get checked immediately. HOME CARE INSTRUCTIONS   Avoid all smoking, herbs, alcohol, and unprescribed drugs. These chemicals affect the formation and growth of the baby.  Follow your caregiver's instructions regarding medicine use. There are medicines that are either safe or unsafe to take during pregnancy.  Exercise only as directed by your caregiver. Experiencing uterine cramps is a good sign to stop exercising.  Continue to eat regular, healthy meals.  Wear a good support bra for breast tenderness.  Do not use hot tubs, steam rooms, or saunas.  Wear your seat belt at all times when driving.  Avoid raw meat, uncooked cheese, cat litter boxes, and soil used by cats. These carry germs that can cause birth defects in the baby.  Take your prenatal vitamins.  Try taking a stool softener (if your caregiver approves) if you develop constipation. Eat more high-fiber foods, such as fresh vegetables or fruit and whole grains. Drink plenty of fluids to keep your urine clear or pale yellow.  Take warm sitz baths to soothe any pain or discomfort caused by hemorrhoids. Use hemorrhoid cream if your caregiver approves.  If you develop varicose veins, wear support hose. Elevate your feet for 15 minutes, 3-4 times a day. Limit salt in your diet.  Avoid heavy lifting, wear low heal shoes, and practice good posture.  Rest a lot with your legs elevated if you have leg cramps or low back pain.  Visit your dentist if you have not gone during your pregnancy. Use a soft toothbrush to brush your teeth and be gentle when you floss.  A sexual relationship may be continued unless your caregiver directs you otherwise.  Do not travel far distances unless it is absolutely necessary and only with the approval  of your caregiver.  Take prenatal classes to understand, practice, and ask questions about the labor and delivery.  Make a trial run to the hospital.  Pack your hospital bag.  Prepare the baby's nursery.  Continue to go to all your prenatal visits as directed by your caregiver. SEEK MEDICAL CARE IF:  You are unsure if you are in labor or if your water has broken.  You have dizziness.  You have mild pelvic cramps, pelvic pressure, or nagging pain in your abdominal area.  You have persistent nausea, vomiting, or diarrhea.  You have a bad smelling vaginal discharge.  You have pain with urination. SEEK IMMEDIATE MEDICAL CARE IF:   You have a fever.  You are leaking fluid from your vagina.  You have spotting or bleeding from your vagina.  You have severe abdominal cramping or pain.  You have rapid weight loss or gain.  You have shortness of breath with chest pain.  You notice sudden or extreme swelling   of your face, hands, ankles, feet, or legs.  You have not felt your baby move in over an hour.  You have severe headaches that do not go away with medicine.  You have vision changes. Document Released: 01/04/2001 Document Revised: 01/15/2013 Document Reviewed: 03/13/2012 [employer] Patient Information 2015 [employer]. This information is not intended to replace advice given to you by your health care provider. Make sure you discuss any questions you have with your health care provider.  

## 2014-10-21 NOTE — Progress Notes (Signed)
Subjective:  Donna Stevens is a 39 y.o. [id number] at [redacted]w[redacted]d being seen today for ongoing prenatal care.  Patient reports no complaints.  Contractions: Irritability.  Vag. Bleeding: None. Movement: Present. Denies leaking of fluid.   The following portions of the patient's history were reviewed and updated as appropriate: allergies, current medications, past family history, past medical history, past social history, past surgical history and problem list.   Objective:   Filed Vitals:   10/21/14 1531 10/21/14 1532  BP: 143/71 125/77  Pulse: 96   Temp: 97.6 F (36.4 C)   Weight: 249 lb 12.8 oz (113.309 kg)     Fetal Status: Fetal Heart Rate (bpm): 146   Movement: Present     General:  Alert, oriented and cooperative. Patient is in no acute distress.  Skin: Skin is warm and dry. No rash noted.   Cardiovascular: Normal heart rate noted  Respiratory: Normal respiratory effort, no problems with respiration noted  Abdomen: Soft, gravid, appropriate for gestational age. Pain/Pressure: Present     Pelvic: Vag. Bleeding: None     Cervical exam deferred        Extremities: Normal range of motion.  Edema: None  Mental Status: Normal mood and affect. Normal behavior. Normal judgment and thought content.   Urinalysis: Urine Protein: Negative Urine Glucose: Negative  Assessment and Plan:  Pregnancy: [id number] at [redacted]w[redacted]d  1. Antepartum multigravida of advanced maternal age, third trimester 2. Previous cesarean delivery, antepartum condition or complication -Signed VBAC consent 9/7 -Updated pregnancy box  3. Maternal obesity syndrome, antepartum, third trimester 4. Group B Streptococcus carrier, +RV culture, currently pregnant -Discussed today need for PCN in labor/with ROM  Term labor symptoms and general obstetric precautions including but not limited to vaginal bleeding, contractions, leaking of fluid and fetal movement were reviewed in detail with the patient. Please refer to After  Visit Summary for other counseling recommendations.  Return in about 1 week (around 10/28/2014) for Routine prenatal care.   Federico Flake, MD

## 2014-10-27 ENCOUNTER — Inpatient Hospital Stay (HOSPITAL_COMMUNITY)
Admission: AD | Admit: 2014-10-27 | Discharge: 2014-10-27 | Disposition: A | Discharge status: Home / Self Care | Payer: Medicaid Other | Source: Ambulatory Visit | Attending: Family Medicine | Admitting: Family Medicine

## 2014-10-27 ENCOUNTER — Encounter (HOSPITAL_COMMUNITY): Payer: Self-pay | Admitting: *Deleted

## 2014-10-27 DIAGNOSIS — Z3689 Encounter for other specified antenatal screening: Secondary | ICD-10-CM

## 2014-10-27 DIAGNOSIS — Z3A38 38 weeks gestation of pregnancy: Secondary | ICD-10-CM | POA: Diagnosis not present

## 2014-10-27 DIAGNOSIS — Z87891 Personal history of nicotine dependence: Secondary | ICD-10-CM | POA: Insufficient documentation

## 2014-10-27 DIAGNOSIS — O99820 Streptococcus B carrier state complicating pregnancy: Secondary | ICD-10-CM | POA: Insufficient documentation

## 2014-10-27 DIAGNOSIS — O368130 Decreased fetal movements, third trimester, not applicable or unspecified: Secondary | ICD-10-CM | POA: Diagnosis not present

## 2014-10-27 LAB — URINALYSIS, ROUTINE W REFLEX MICROSCOPIC
Bilirubin Urine: NEGATIVE
Glucose, UA: NEGATIVE mg/dL
Hgb urine dipstick: NEGATIVE
Ketones, ur: 15 mg/dL — AB
NITRITE: NEGATIVE
Protein, ur: NEGATIVE mg/dL
SPECIFIC GRAVITY, URINE: 1.02 (ref 1.005–1.030)
UROBILINOGEN UA: 1 mg/dL (ref 0.0–1.0)
pH: 6 (ref 5.0–8.0)

## 2014-10-27 LAB — URINE MICROSCOPIC-ADD ON

## 2014-10-27 NOTE — Discharge Instructions (Signed)

## 2014-10-27 NOTE — MAU Note (Signed)
Pt reports contractions q 5 minutes all day, ? Leaking fluid since this am. States she has only felt the baby move 4 times today.

## 2014-10-27 NOTE — MAU Provider Note (Signed)
History     CSN: [id number]  Arrival date and time: 10/27/14 1911   First Provider Initiated Contact with Patient 10/27/14 2033      No chief complaint on file.  HPI Comments: Donna Stevens is a 39 y.o. [id number] at [redacted]w[redacted]d who presents today with cramping and decreased fetal movement. She states that she had felt the baby move about 5 times today. She states that since being here the fetus has been active. She denies any vaginal bleeding or LOF.   Abdominal Pain This is a new problem. The current episode started today. The onset quality is gradual. The problem occurs intermittently. The problem has been unchanged. The pain is located in the suprapubic region. The pain is at a severity of 2/10. The quality of the pain is cramping. The abdominal pain does not radiate. Nothing aggravates the pain. The pain is relieved by nothing. She has tried nothing for the symptoms.     Past Medical History  Diagnosis Date  . Previous cesarean delivery, antepartum condition or complication   . Maternal obesity syndrome, antepartum     Past Surgical History  Procedure Laterality Date  . Cesarean section  1996  . Neuroplasty / transposition median nerve at carpal tunnel  2006    Family History  Problem Relation Age of Onset  . Hepatitis Mother     Social History  Substance Use Topics  . Smoking status: Former Smoker -- 1.00 packs/day for 10 years    Quit date: 05/25/2014  . Smokeless tobacco: Never Used     Comment: Per patient need prescription to help.  . Alcohol Use: No    Allergies:  Allergies  Allergen Reactions  . Codeine Hives    Prescriptions prior to admission  Medication Sig Dispense Refill Last Dose  . acetaminophen (TYLENOL) 325 MG tablet Take 650 mg by mouth every 6 (six) hours as needed for moderate pain.   Past Week at Unknown time  . Calcium-Magnesium-Vitamin D (CALCIUM 500 PO) Take 1 tablet by mouth daily.   10/27/2014 at Unknown time  . IRON PO Take 1  tablet by mouth daily.   Past Week at Unknown time  . Prenatal Vit-Fe Fumarate-FA (MULTIVITAMIN-PRENATAL) 27-0.8 MG TABS tablet Take 1 tablet by mouth daily at 12 noon. 30 each 3 10/27/2014 at Unknown time  . vitamin C (ASCORBIC ACID) 250 MG tablet Take 250 mg by mouth daily.     10/27/2014 at Unknown time    Review of Systems  Gastrointestinal: Positive for abdominal pain.   Physical Exam   Blood pressure 124/72, pulse 90, temperature 98.1 F (36.7 C), temperature source Oral, resp. rate 16, height  (1.651 m), weight 114.76 kg (253 lb), last menstrual period 03/31/2014, SpO2 98 %.  Physical Exam  Nursing note and vitals reviewed. Constitutional: She is oriented to person, place, and time. She appears well-developed and well-nourished. No distress.  HENT:  Head: Normocephalic.  Eyes: EOM are normal.  Cardiovascular: Normal rate.   Respiratory: Effort normal.  GI: Soft.  Musculoskeletal: Normal range of motion.  Neurological: She is alert and oriented to person, place, and time.  Skin: Skin is warm and dry.  Psychiatric: She has a normal mood and affect.   FHT: 140, moderate with 15x15 accels, no decels Toco: no ucs    MAU Course  Procedures  MDM   Assessment and Plan   1. Group B Streptococcus carrier, +RV culture, currently pregnant   2. Decreased fetal movement in  pregnancy, third trimester, not applicable or unspecified fetus   3. NST (non-stress test) reactive    DC home Comfort measures reviewed  3rd Trimester precautions  Labor precautions  Fetal kick counts RX: none  Return to MAU as needed   Follow-up Information    Follow up with [facility].   Specialty:  Obstetrics and Gynecology   Why:  As scheduled   Contact information:   [address] [place] [address] [phone]        Tawnya Crook 10/27/2014, 8:40 PM

## 2014-10-28 ENCOUNTER — Ambulatory Visit (INDEPENDENT_AMBULATORY_CARE_PROVIDER_SITE_OTHER): Payer: Medicaid Other | Admitting: Advanced Practice Midwife

## 2014-10-28 ENCOUNTER — Other Ambulatory Visit: Payer: Self-pay | Admitting: Advanced Practice Midwife

## 2014-10-28 ENCOUNTER — Ambulatory Visit (HOSPITAL_COMMUNITY)
Admission: RE | Admit: 2014-10-28 | Discharge: 2014-10-28 | Disposition: A | Discharge status: Home / Self Care | Payer: Medicaid Other | Source: Ambulatory Visit | Attending: Advanced Practice Midwife | Admitting: Advanced Practice Midwife

## 2014-10-28 VITALS — BP 119/70 | HR 90 | Temp 98.1°F | Wt 248.7 lb

## 2014-10-28 DIAGNOSIS — O09523 Supervision of elderly multigravida, third trimester: Secondary | ICD-10-CM

## 2014-10-28 DIAGNOSIS — O34219 Maternal care for unspecified type scar from previous cesarean delivery: Secondary | ICD-10-CM | POA: Diagnosis not present

## 2014-10-28 DIAGNOSIS — Z3A38 38 weeks gestation of pregnancy: Secondary | ICD-10-CM | POA: Diagnosis not present

## 2014-10-28 DIAGNOSIS — O368130 Decreased fetal movements, third trimester, not applicable or unspecified: Secondary | ICD-10-CM

## 2014-10-28 LAB — POCT URINALYSIS DIP (DEVICE)
BILIRUBIN URINE: NEGATIVE
Glucose, UA: NEGATIVE mg/dL
HGB URINE DIPSTICK: NEGATIVE
KETONES UR: NEGATIVE mg/dL
Nitrite: NEGATIVE
Protein, ur: NEGATIVE mg/dL
Specific Gravity, Urine: 1.02 (ref 1.005–1.030)
Urobilinogen, UA: 1 mg/dL (ref 0.0–1.0)
pH: 6 (ref 5.0–8.0)

## 2014-10-28 NOTE — Progress Notes (Signed)
Subjective:  Donna Stevens is a 39 y.o. [id number] at [redacted]w[redacted]d being seen today for ongoing prenatal care.  Patient reports decreased fetal movement.  Contractions: Irritability.  Vag. Bleeding: None. Movement: (!) Decreased. Denies leaking of fluid.   The following portions of the patient's history were reviewed and updated as appropriate: allergies, current medications, past family history, past medical history, past social history, past surgical history and problem list.   Objective:   Filed Vitals:   10/28/14 1417  BP: 119/70  Pulse: 90  Temp: 98.1 F (36.7 C)  Weight: 248 lb 11.2 oz (112.81 kg)    Fetal Status: Fetal Heart Rate (bpm): 140   Movement: (!) Decreased     General:  Alert, oriented and cooperative. Patient is in no acute distress.  Skin: Skin is warm and dry. No rash noted.   Cardiovascular: Normal heart rate noted  Respiratory: Normal respiratory effort, no problems with respiration noted  Abdomen: Soft, gravid, appropriate for gestational age. Pain/Pressure: Present     Pelvic: Vag. Bleeding: None     Cervical exam deferred        Extremities: Normal range of motion.  Edema: Trace  Mental Status: Normal mood and affect. Normal behavior. Normal judgment and thought content.   Urinalysis: Urine Protein: Negative Urine Glucose: Negative  Assessment and Plan:  Pregnancy: [id number] at [redacted]w[redacted]d  1. Antepartum multigravida of advanced maternal age, third trimester  2.  Decreased Fetal Movement Sent pt to BPP from clinic visit today 8/8 on BPP   Term labor symptoms and general obstetric precautions including but not limited to vaginal bleeding, contractions, leaking of fluid and fetal movement were reviewed in detail with the patient. Please refer to After Visit Summary for other counseling recommendations.  Return in about 1 week (around 11/04/2014).   Hurshel Party, CNM

## 2014-10-28 NOTE — Progress Notes (Signed)
Breastfeeding tip of the week reviewed Flu declined 

## 2014-11-04 ENCOUNTER — Ambulatory Visit (INDEPENDENT_AMBULATORY_CARE_PROVIDER_SITE_OTHER): Payer: Medicaid Other | Admitting: Obstetrics and Gynecology

## 2014-11-04 VITALS — BP 129/81 | HR 97 | Temp 98.3°F | Wt 251.0 lb

## 2014-11-04 DIAGNOSIS — O09523 Supervision of elderly multigravida, third trimester: Secondary | ICD-10-CM

## 2014-11-04 DIAGNOSIS — Z3492 Encounter for supervision of normal pregnancy, unspecified, second trimester: Secondary | ICD-10-CM

## 2014-11-04 LAB — POCT URINALYSIS DIP (DEVICE)
Bilirubin Urine: NEGATIVE
Glucose, UA: NEGATIVE mg/dL
Hgb urine dipstick: NEGATIVE
KETONES UR: NEGATIVE mg/dL
Nitrite: NEGATIVE
PH: 7 (ref 5.0–8.0)
Protein, ur: NEGATIVE mg/dL
SPECIFIC GRAVITY, URINE: 1.015 (ref 1.005–1.030)
Urobilinogen, UA: 0.2 mg/dL (ref 0.0–1.0)

## 2014-11-04 NOTE — Progress Notes (Signed)
Subjective:  Donna Stevens is a 39 y.o. [id number] at [redacted]w[redacted]d being seen today for ongoing prenatal care.  Patient reports no complaints.  Contractions: Irritability.  Vag. Bleeding: None. Movement: Present. Denies leaking of fluid.   The following portions of the patient's history were reviewed and updated as appropriate: allergies, current medications, past family history, past medical history, past social history, past surgical history and problem list. Problem list updated.  Objective:   Filed Vitals:   11/04/14 1459 11/04/14 1500  BP: 144/74 129/81  Pulse: 97   Temp: 98.3 F (36.8 C)   Weight: 251 lb (113.853 kg)     Fetal Status: Fetal Heart Rate (bpm): 138 Fundal Height: 42 cm Movement: Present  Presentation: Vertex  General:  Alert, oriented and cooperative. Patient is in no acute distress.  Skin: Skin is warm and dry. No rash noted.   Cardiovascular: Normal heart rate noted  Respiratory: Normal respiratory effort, no problems with respiration noted  Abdomen: Soft, gravid, appropriate for gestational age. Pain/Pressure: Present     Pelvic: Vag. Bleeding: None     Cervical exam deferred        Extremities: Normal range of motion.  Edema: None  Mental Status: Normal mood and affect. Normal behavior. Normal judgment and thought content.   Urinalysis:      Assessment and Plan:  Pregnancy: [id number] at [redacted]w[redacted]d  # Pregnancy - gbs positive, pcn in labor - f/u 1 week for nst/afi, induce at 41 - initial bp mild systolic elevation; 2 subsequent repeats wnl, asymptomatic, no protein in urinalysis. Gave preeclampsia return precautions, did not check further labs - normal fetal movement, reviewed kick counts (decreased movement at last visit, normal bpp)  Term labor symptoms and general obstetric precautions including but not limited to vaginal bleeding, contractions, leaking of fluid and fetal movement were reviewed in detail with the patient. Please refer to After Visit  Summary for other counseling recommendations.  Return in about 1 week (around 11/11/2014).   Kathrynn Running, MD

## 2014-11-11 ENCOUNTER — Encounter: Payer: Self-pay | Admitting: Certified Nurse Midwife

## 2014-11-11 ENCOUNTER — Ambulatory Visit (INDEPENDENT_AMBULATORY_CARE_PROVIDER_SITE_OTHER): Payer: Medicaid Other | Admitting: Certified Nurse Midwife

## 2014-11-11 VITALS — BP 116/68 | HR 81 | Wt 252.3 lb

## 2014-11-11 DIAGNOSIS — O480 Post-term pregnancy: Secondary | ICD-10-CM | POA: Diagnosis not present

## 2014-11-11 NOTE — Progress Notes (Signed)
Subjective:  Donna Stevens is a 39 y.o. [id number] at 7364w1d being seen today for ongoing prenatal care.  Patient reports no complaints.  Contractions: Not present.  Vag. Bleeding: None. Movement: Present. Denies leaking of fluid.   The following portions of the patient's history were reviewed and updated as appropriate: allergies, current medications, past family history, past medical history, past social history, past surgical history and problem list. Problem list updated.  Objective:   Filed Vitals:   11/11/14 1318  BP: 116/68  Pulse: 81  Weight: 252 lb 4.8 oz (114.443 kg)    Fetal Status: Fetal Heart Rate (bpm): NST   Movement: Present     General:  Alert, oriented and cooperative. Patient is in no acute distress.  Skin: Skin is warm and dry. No rash noted.   Cardiovascular: Normal heart rate noted  Respiratory: Normal respiratory effort, no problems with respiration noted  Abdomen: Soft, gravid, appropriate for gestational age. Pain/Pressure: Present     Pelvic: Vag. Bleeding: None     Cervical exam deferred        Extremities: Normal range of motion.  Edema: None  Mental Status: Normal mood and affect. Normal behavior. Normal judgment and thought content.   Urinalysis:      Assessment and Plan:  Pregnancy: [id number] at 7464w1d  1. Post term pregnancy, antepartum condition or complication Nst Reactive Induction scheduled for next Tuesday - Amniotic fluid index with NST VBAC consent signed Term labor symptoms and general obstetric precautions including but not limited to vaginal bleeding, contractions, leaking of fluid and fetal movement were reviewed in detail with the patient. Please refer to After Visit Summary for other counseling recommendations.  No Follow-up on file.   Rhea PinkLori A Sheyla Zaffino, CNM

## 2014-11-11 NOTE — Progress Notes (Signed)
Pt reports frequent episodes of acid reflux - Tums are not helping.  Breastfeeding tip of the week reviewed.  IOL scheduled 10/25 @ 0730.

## 2014-11-12 ENCOUNTER — Telehealth: Payer: Self-pay | Admitting: *Deleted

## 2014-11-12 NOTE — Telephone Encounter (Signed)
Called pt and left message that her appt for NST is Friday 10/21 @ 1000.

## 2014-11-13 ENCOUNTER — Telehealth (HOSPITAL_COMMUNITY): Payer: Self-pay | Admitting: *Deleted

## 2014-11-13 NOTE — Telephone Encounter (Signed)
Preadmission screen  

## 2014-11-14 ENCOUNTER — Ambulatory Visit (INDEPENDENT_AMBULATORY_CARE_PROVIDER_SITE_OTHER): Payer: Medicaid Other | Admitting: *Deleted

## 2014-11-14 VITALS — BP 122/68 | HR 89

## 2014-11-14 DIAGNOSIS — O480 Post-term pregnancy: Secondary | ICD-10-CM | POA: Diagnosis not present

## 2014-11-14 NOTE — Progress Notes (Signed)
10/21 NST reviewed and reactive 

## 2014-11-15 ENCOUNTER — Encounter (HOSPITAL_COMMUNITY): Payer: Self-pay

## 2014-11-15 ENCOUNTER — Inpatient Hospital Stay (HOSPITAL_COMMUNITY)
Admission: AD | Admit: 2014-11-15 | Discharge: 2014-11-16 | DRG: 775 | Disposition: A | Discharge status: Home / Self Care | Payer: Medicaid Other | Source: Ambulatory Visit | Attending: Obstetrics and Gynecology | Admitting: Obstetrics and Gynecology

## 2014-11-15 ENCOUNTER — Inpatient Hospital Stay (HOSPITAL_COMMUNITY): Payer: Medicaid Other | Admitting: Anesthesiology

## 2014-11-15 DIAGNOSIS — Z87442 Personal history of urinary calculi: Secondary | ICD-10-CM

## 2014-11-15 DIAGNOSIS — O99210 Obesity complicating pregnancy, unspecified trimester: Secondary | ICD-10-CM | POA: Diagnosis present

## 2014-11-15 DIAGNOSIS — O99214 Obesity complicating childbirth: Secondary | ICD-10-CM | POA: Diagnosis present

## 2014-11-15 DIAGNOSIS — E669 Obesity, unspecified: Secondary | ICD-10-CM | POA: Diagnosis present

## 2014-11-15 DIAGNOSIS — Z6841 Body Mass Index (BMI) 40.0 and over, adult: Secondary | ICD-10-CM | POA: Diagnosis not present

## 2014-11-15 DIAGNOSIS — O09523 Supervision of elderly multigravida, third trimester: Secondary | ICD-10-CM | POA: Diagnosis not present

## 2014-11-15 DIAGNOSIS — Z87891 Personal history of nicotine dependence: Secondary | ICD-10-CM | POA: Diagnosis not present

## 2014-11-15 DIAGNOSIS — K219 Gastro-esophageal reflux disease without esophagitis: Secondary | ICD-10-CM | POA: Diagnosis present

## 2014-11-15 DIAGNOSIS — Z3A40 40 weeks gestation of pregnancy: Secondary | ICD-10-CM

## 2014-11-15 DIAGNOSIS — IMO0001 Reserved for inherently not codable concepts without codable children: Secondary | ICD-10-CM

## 2014-11-15 DIAGNOSIS — O9902 Anemia complicating childbirth: Secondary | ICD-10-CM | POA: Diagnosis present

## 2014-11-15 DIAGNOSIS — O480 Post-term pregnancy: Principal | ICD-10-CM | POA: Diagnosis not present

## 2014-11-15 DIAGNOSIS — O34211 Maternal care for low transverse scar from previous cesarean delivery: Secondary | ICD-10-CM | POA: Diagnosis present

## 2014-11-15 DIAGNOSIS — O99824 Streptococcus B carrier state complicating childbirth: Secondary | ICD-10-CM | POA: Diagnosis present

## 2014-11-15 DIAGNOSIS — O99820 Streptococcus B carrier state complicating pregnancy: Secondary | ICD-10-CM

## 2014-11-15 HISTORY — DX: Calculus of kidney: N20.0

## 2014-11-15 HISTORY — DX: Personal history of nicotine dependence: Z87.891

## 2014-11-15 LAB — TYPE AND SCREEN
ABO/RH(D): O POS
Antibody Screen: NEGATIVE

## 2014-11-15 LAB — CBC
HEMATOCRIT: 33.9 % — AB (ref 36.0–46.0)
HEMOGLOBIN: 11 g/dL — AB (ref 12.0–15.0)
MCH: 32 pg (ref 26.0–34.0)
MCHC: 32.4 g/dL (ref 30.0–36.0)
MCV: 98.5 fL (ref 78.0–100.0)
Platelets: 222 10*3/uL (ref 150–400)
RBC: 3.44 MIL/uL — ABNORMAL LOW (ref 3.87–5.11)
RDW: 14.5 % (ref 11.5–15.5)
WBC: 10.9 10*3/uL — ABNORMAL HIGH (ref 4.0–10.5)

## 2014-11-15 LAB — RPR: RPR Ser Ql: NONREACTIVE

## 2014-11-15 MED ORDER — EPHEDRINE 5 MG/ML INJ
10.0000 mg | INTRAVENOUS | Status: DC | PRN
  Filled 2014-11-15: qty 2

## 2014-11-15 MED ORDER — FENTANYL CITRATE (PF) 100 MCG/2ML IJ SOLN: 100.0000 ug | INTRAMUSCULAR | Status: DC | PRN

## 2014-11-15 MED ORDER — ONDANSETRON HCL 4 MG/2ML IJ SOLN: 4.0000 mg | INTRAMUSCULAR | Status: DC | PRN

## 2014-11-15 MED ORDER — OXYTOCIN BOLUS FROM INFUSION
500.0000 mL | INTRAVENOUS | Status: DC
  Administered 2014-11-15: 500 mL via INTRAVENOUS

## 2014-11-15 MED ORDER — PHENYLEPHRINE 40 MCG/ML (10ML) SYRINGE FOR IV PUSH (FOR BLOOD PRESSURE SUPPORT)
80.0000 ug | PREFILLED_SYRINGE | INTRAVENOUS | Status: DC | PRN
Start: 2014-11-15 — End: 2014-11-15
  Filled 2014-11-15: qty 2

## 2014-11-15 MED ORDER — PHENYLEPHRINE 40 MCG/ML (10ML) SYRINGE FOR IV PUSH (FOR BLOOD PRESSURE SUPPORT)
PREFILLED_SYRINGE | INTRAVENOUS | Status: AC
  Filled 2014-11-15: qty 20

## 2014-11-15 MED ORDER — SENNOSIDES-DOCUSATE SODIUM 8.6-50 MG PO TABS
2.0000 | ORAL_TABLET | ORAL | Status: DC
  Administered 2014-11-15: 2 via ORAL
  Filled 2014-11-15: qty 2

## 2014-11-15 MED ORDER — ACETAMINOPHEN 325 MG PO TABS: 650.0000 mg | ORAL_TABLET | ORAL | Status: DC | PRN

## 2014-11-15 MED ORDER — TETANUS-DIPHTH-ACELL PERTUSSIS 5-2.5-18.5 LF-MCG/0.5 IM SUSP: 0.5000 mL | Freq: Once | INTRAMUSCULAR | Status: DC

## 2014-11-15 MED ORDER — IBUPROFEN 600 MG PO TABS
600.0000 mg | ORAL_TABLET | Freq: Four times a day (QID) | ORAL | Status: DC
  Administered 2014-11-15 – 2014-11-16 (×4): 600 mg via ORAL
  Filled 2014-11-15 (×4): qty 1

## 2014-11-15 MED ORDER — LACTATED RINGERS IV SOLN
INTRAVENOUS | Status: DC
  Administered 2014-11-15: 05:00:00 via INTRAVENOUS

## 2014-11-15 MED ORDER — ONDANSETRON HCL 4 MG/2ML IJ SOLN: 4.0000 mg | Freq: Four times a day (QID) | INTRAMUSCULAR | Status: DC | PRN

## 2014-11-15 MED ORDER — OXYTOCIN 40 UNITS IN LACTATED RINGERS INFUSION - SIMPLE MED
62.5000 mL/h | INTRAVENOUS | Status: DC
  Filled 2014-11-15: qty 1000

## 2014-11-15 MED ORDER — LACTATED RINGERS IV SOLN
500.0000 mL | INTRAVENOUS | Status: DC | PRN
  Administered 2014-11-15: 1000 mL via INTRAVENOUS

## 2014-11-15 MED ORDER — FENTANYL 2.5 MCG/ML BUPIVACAINE 1/10 % EPIDURAL INFUSION (WH - ANES)
INTRAMUSCULAR | Status: AC
  Filled 2014-11-15: qty 125

## 2014-11-15 MED ORDER — SIMETHICONE 80 MG PO CHEW: 80.0000 mg | CHEWABLE_TABLET | ORAL | Status: DC | PRN

## 2014-11-15 MED ORDER — DIPHENHYDRAMINE HCL 50 MG/ML IJ SOLN: 12.5000 mg | INTRAMUSCULAR | Status: DC | PRN

## 2014-11-15 MED ORDER — FENTANYL 2.5 MCG/ML BUPIVACAINE 1/10 % EPIDURAL INFUSION (WH - ANES)
14.0000 mL/h | INTRAMUSCULAR | Status: DC | PRN
  Administered 2014-11-15 (×2): 14 mL/h via EPIDURAL

## 2014-11-15 MED ORDER — LIDOCAINE HCL (PF) 1 % IJ SOLN
30.0000 mL | INTRAMUSCULAR | Status: DC | PRN
Start: 2014-11-15 — End: 2014-11-15
  Filled 2014-11-15: qty 30

## 2014-11-15 MED ORDER — WITCH HAZEL-GLYCERIN EX PADS: 1.0000 "application " | MEDICATED_PAD | CUTANEOUS | Status: DC | PRN

## 2014-11-15 MED ORDER — BENZOCAINE-MENTHOL 20-0.5 % EX AERO: 1.0000 "application " | INHALATION_SPRAY | CUTANEOUS | Status: DC | PRN

## 2014-11-15 MED ORDER — PRENATAL MULTIVITAMIN CH
1.0000 | ORAL_TABLET | Freq: Every day | ORAL | Status: DC
  Administered 2014-11-15: 1 via ORAL
  Filled 2014-11-15: qty 1

## 2014-11-15 MED ORDER — ONDANSETRON HCL 4 MG PO TABS: 4.0000 mg | ORAL_TABLET | ORAL | Status: DC | PRN

## 2014-11-15 MED ORDER — SODIUM CHLORIDE 0.9 % IV SOLN
2.0000 g | Freq: Once | INTRAVENOUS | Status: AC
  Administered 2014-11-15: 2 g via INTRAVENOUS
  Filled 2014-11-15 (×2): qty 2000

## 2014-11-15 MED ORDER — CITRIC ACID-SODIUM CITRATE 334-500 MG/5ML PO SOLN: 30.0000 mL | ORAL | Status: DC | PRN

## 2014-11-15 MED ORDER — DIBUCAINE 1 % RE OINT: 1.0000 "application " | TOPICAL_OINTMENT | RECTAL | Status: DC | PRN

## 2014-11-15 MED ORDER — LANOLIN HYDROUS EX OINT: TOPICAL_OINTMENT | CUTANEOUS | Status: DC | PRN

## 2014-11-15 MED ORDER — LIDOCAINE HCL (PF) 1 % IJ SOLN
INTRAMUSCULAR | Status: DC | PRN
  Administered 2014-11-15 (×2): 4 mL via EPIDURAL

## 2014-11-15 MED ORDER — ZOLPIDEM TARTRATE 5 MG PO TABS: 5.0000 mg | ORAL_TABLET | Freq: Every evening | ORAL | Status: DC | PRN

## 2014-11-15 MED ORDER — DIPHENHYDRAMINE HCL 25 MG PO CAPS: 25.0000 mg | ORAL_CAPSULE | Freq: Four times a day (QID) | ORAL | Status: DC | PRN

## 2014-11-15 NOTE — Anesthesia Postprocedure Evaluation (Signed)
  Anesthesia Post-op Note  Patient: Donna RushingMelissa Ann Bow  Procedure(s) Performed: * No procedures listed *  Patient Location: Mother Baby  Anesthesia Type:Epidural  Level of Consciousness: awake and alert   Airway and Oxygen Therapy: Patient Spontanous Breathing  Post-op Pain: mild  Post-op Assessment: Post-op Vital signs reviewed, Patient's Cardiovascular Status Stable, Respiratory Function Stable, No signs of Nausea or vomiting, Pain level controlled, No headache, Spinal receding and Patient able to bend at knees              Post-op Vital Signs: Reviewed  Last Vitals:  Filed Vitals:   11/15/14 1300  BP: 120/67  Pulse: 73  Temp: 36.8 C  Resp: 20    Complications: No apparent anesthesia complications

## 2014-11-15 NOTE — Anesthesia Preprocedure Evaluation (Signed)
Anesthesia Evaluation  Patient identified by MRN, date of birth, ID band Patient awake    Reviewed: Allergy & Precautions, NPO status , Patient's Chart, lab work & pertinent test results  Airway Mallampati: III  TM Distance: >3 FB Neck ROM: Full    Dental no notable dental hx. (+) Teeth Intact   Pulmonary former smoker,    Pulmonary exam normal breath sounds clear to auscultation       Cardiovascular negative cardio ROS Normal cardiovascular exam Rhythm:Regular Rate:Normal     Neuro/Psych negative neurological ROS  negative psych ROS   GI/Hepatic Neg liver ROS, PUD, GERD  Medicated and Controlled,  Endo/Other  Morbid obesity  Renal/GU Renal diseaseHx/o renal calculi  negative genitourinary   Musculoskeletal negative musculoskeletal ROS (+)   Abdominal (+) + obese,   Peds  Hematology  (+) anemia ,   Anesthesia Other Findings   Reproductive/Obstetrics (+) Pregnancy Previous C/section                             Anesthesia Physical Anesthesia Plan  ASA: III  Anesthesia Plan: Epidural   Post-op Pain Management:    Induction:   Airway Management Planned: Natural Airway  Additional Equipment:   Intra-op Plan:   Post-operative Plan:   Informed Consent: I have reviewed the patients History and Physical, chart, labs and discussed the procedure including the risks, benefits and alternatives for the proposed anesthesia with the patient or authorized representative who has indicated his/her understanding and acceptance.     Plan Discussed with: Anesthesiologist  Anesthesia Plan Comments:         Anesthesia Quick Evaluation

## 2014-11-15 NOTE — Progress Notes (Signed)
Infant taken to warmer at 5 min of age.  Infant simulated and bulb suctioned. 18 ml was suctioned through the delee.  After infant had O2 of 92% infant placed skin to skin with mom.

## 2014-11-15 NOTE — H&P (Signed)
Donna Stevens is a 39 y.o. female [phone] @ 40.5wks presenting for onset of labor. Possibly leaking fluid, but not a large gush. No bldg. [facility] and has been remarkable for  1) prev C/S followed by VBAC 2) GBS pos 3) inc weight gain in preg 4) AMA  History OB History    Gravida Para Term Preterm AB TAB SAB Ectopic Multiple Living   4 2 2  0 1 0 1 0 0 2     Past Medical History  Diagnosis Date  . Previous cesarean delivery, antepartum condition or complication   . Maternal obesity syndrome, antepartum   . Kidney stone    Past Surgical History  Procedure Laterality Date  . Cesarean section  1996  . Neuroplasty / transposition median nerve at carpal tunnel  2006   Family History: family history includes Hepatitis in her mother. Social History:  reports that she quit smoking about 5 months ago. She has never used smokeless tobacco. She reports that she does not drink alcohol or use illicit drugs.   Prenatal Transfer Tool  Maternal Diabetes: No Genetic Screening: Declined Maternal Ultrasounds/Referrals: Normal Fetal Ultrasounds or other Referrals:  None Maternal Substance Abuse:  No Significant Maternal Medications:  None Significant Maternal Lab Results:  Lab values include: Group B Strep positive Other Comments:  None  ROS  Dilation: 7 Effacement (%): 90 Station: -2 Exam by:: Donna DennisonBenji Stanley RN Blood pressure 151/85, pulse 93, temperature 97.7 F (36.5 C), resp. rate 18, height 5' 5.5" (1.664 m), weight 116.03 kg (255 lb 12.8 oz), last menstrual period 03/31/2014. Exam Physical Exam  Constitutional: She is oriented to person, place, and time. She appears well-developed.  HENT:  Head: Normocephalic.  Neck: Normal range of motion.  Cardiovascular: Normal rate.   Respiratory: Effort normal.  GI:  EFM 140s, one 10x10accels, occ mi variables, min variability (on monitor briefly) Ctx q 4-6 mins  Musculoskeletal: Normal range of motion.   Neurological: She is alert and oriented to person, place, and time.  Skin: Skin is warm and dry.  Psychiatric: She has a normal mood and affect. Her behavior is normal. Thought content normal.    Prenatal labs: ABO, Rh: O/POS/-- (06/09 1551) Antibody: NEG (06/09 1551) Rubella: 2.51 (06/09 1551) RPR: NON REAC (06/09 1551)  HBsAg: NEGATIVE (06/09 1551)  HIV: NONREACTIVE (06/09 1551)  GBS: Positive (09/20 0000)   Assessment/Plan: IUP@40 .5wks Active labor TOLAC  [facility] Amp for GBS Anticipate SVD   Murray Guzzetta CNM 11/15/2014, 2:02 AM

## 2014-11-15 NOTE — Lactation Note (Signed)
This note was copied from the chart of Donna Edwin DadaMelissa Stevens. Lactation Consultation Note  Patient Name: Donna Stevens Today's Date: 11/15/2014 Reason for consult: Initial assessment Baby at 10 hr of life, experienced bf mom. She stated that she did not even try bf her oldest, bf her 2nd child for 2747m until he was so hungry she had to supplement. She repots that after supplementing just a few times her milk dried up. She does not have health insurance and [facility]. Talked about ways she could get a pump. Given handouts, aware of O/P lactation and support group. She stated that baby is feeding really well and she sees milk in the baby's mouth. She can manually express colostrum bilaterally. Went over breast and nipple care. She will call as needed for bf help.   Maternal Data Has patient been taught Hand Expression?: Yes Does the patient have breastfeeding experience prior to this delivery?: Yes  Feeding Feeding Type: Breast Fed Length of feed: 15 min  LATCH Score/Interventions Latch: Grasps breast easily, tongue down, lips flanged, rhythmical sucking.  Audible Swallowing: A few with stimulation Intervention(s): Skin to skin;Alternate breast massage  Type of Nipple: Everted at rest and after stimulation  Comfort (Breast/Nipple): Soft / non-tender     Hold (Positioning): No assistance needed to correctly position infant at breast.  LATCH Score: 9  Lactation Tools Discussed/Used WIC Program: No   Consult Status Consult Status: Follow-up Date: 11/16/14 Follow-up type: In-patient    Rulon Eisenmengerlizabeth E Kimberlynn Lumbra 11/15/2014, 4:13 PM

## 2014-11-15 NOTE — Anesthesia Procedure Notes (Signed)
Epidural Patient location during procedure: OB Start time: 11/15/2014 2:47 AM  Staffing Anesthesiologist: Mal AmabileFOSTER, Edouard Gikas  Preanesthetic Checklist Completed: patient identified, site marked, surgical consent, pre-op evaluation, timeout performed, IV checked, risks and benefits discussed and monitors and equipment checked  Epidural Patient position: sitting Prep: site prepped and draped and DuraPrep Patient monitoring: continuous pulse ox and blood pressure Approach: midline Location: L3-L4 Injection technique: LOR air  Needle:  Needle type: Tuohy  Needle gauge: 17 G Needle length: 9 cm and 9 Needle insertion depth: 7 cm Catheter type: closed end flexible Catheter size: 19 Gauge Catheter at skin depth: 12 cm Test dose: negative and Other  Assessment Events: blood not aspirated, injection not painful, no injection resistance, negative IV test and no paresthesia  Additional Notes Patient identified. Risks and benefits discussed including failed block, incomplete  Pain control, post dural puncture headache, nerve damage, paralysis, blood pressure Changes, nausea, vomiting, reactions to medications-both toxic and allergic and post Partum back pain. All questions were answered. Patient expressed understanding and wished to proceed. Sterile technique was used throughout procedure. Epidural site was Dressed with sterile barrier dressing. No paresthesias, signs of intravascular injection Or signs of intrathecal spread were encountered.  Patient was more comfortable after the epidural was dosed. Please see RN's note for documentation of vital signs and FHR which are stable.

## 2014-11-15 NOTE — Progress Notes (Signed)
Admission nutrition screen triggered for unintentional weight loss > 10 lbs within the last month. . Patients chart reviewed and assessed  for nutritional risk. Patient is determined to be at low nutrition  risk.    Rease Wence M.Ed. R.D. LDN Neonatal Nutrition Support Specialist/RD III Pager [phone]      Phone [phone]  

## 2014-11-15 NOTE — MAU Note (Signed)
Strong ctxs for about 4hrs. About ago had a gush of fld. And has cont to leak some since then. For induction TUes

## 2014-11-16 ENCOUNTER — Ambulatory Visit: Payer: Self-pay

## 2014-11-16 MED ORDER — IBUPROFEN 600 MG PO TABS: 600.0000 mg | ORAL_TABLET | Freq: Four times a day (QID) | ORAL | Status: DC

## 2014-11-16 NOTE — Discharge Summary (Signed)
OB Discharge Summary     Patient Name: Donna RushingMelissa Ann Stevens DOB: 07/15/1975 MRN: [id number]  Date of admission: 11/15/2014 Delivering MD: Cam HaiSHAW, KIMBERLY D   Date of discharge: 11/16/2014  Admitting diagnosis: 4172w5d, CTX Intrauterine pregnancy: 5172w5d     Secondary diagnosis:  Active Problems:   Active labor at term  Additional problems: none     Discharge diagnosis: Term Pregnancy Delivered and VBAC                                                                                                Post partum procedures:none  Augmentation: none  Complications: None  Hospital course:  Onset of Labor With Vaginal Delivery     39 y.o. yo [id number] at 5172w5d was admitted in Active Laboron 11/15/2014. Patient had an uncomplicated labor course as follows:  Membrane Rupture Time/Date: 4:57 AM ,11/15/2014   Intrapartum Procedures: Episiotomy: None [1]                                         Lacerations:  None [1]  Patient had a delivery of a Viable infant. 11/15/2014  Information for the patient's newborn:  Marcille BuffyMartell, Girl Pearson [[id number]][[id number]]  Delivery Method: Vaginal, Spontaneous Delivery (Filed from Delivery Summary)    Pateint had an uncomplicated postpartum course.  She is ambulating, tolerating a regular diet, passing flatus, and urinating well. Patient is discharged home in stable condition on 10/23.   Of note, patient GBS positive, was given ampicillin on arrival, but delivered less than 4 hours after receiving that first dose, constituting inadequate treatment.    Physical exam  Filed Vitals:   11/15/14 0717 11/15/14 0800 11/15/14 1300 11/15/14 1815  BP: 133/64 135/64 120/67 121/77  Pulse: 96 92 73 70  Temp:  98.3 F (36.8 C) 98.3 F (36.8 C) 97.5 F (36.4 C)  TempSrc:  Oral Other (Comment) Oral  Resp:  20 20 18   Height:      Weight:      SpO2:       General: alert, cooperative and no distress Lochia: appropriate Uterine Fundus: firm Incision: N/A DVT Evaluation:  No cords or calf tenderness. Labs: Lab Results  Component Value Date   WBC 10.9* 11/15/2014   HGB 11.0* 11/15/2014   HCT 33.9* 11/15/2014   MCV 98.5 11/15/2014   PLT 222 11/15/2014   No flowsheet data found.  Discharge instruction: per After Visit Summary and "Baby and Me Booklet".  Medications:  Current facility-administered medications:  .  acetaminophen (TYLENOL) tablet 650 mg, 650 mg, Oral, Q4H PRN, Arabella MerlesKimberly D Shaw, CNM .  benzocaine-Menthol (DERMOPLAST) 20-0.5 % topical spray 1 application, 1 application, Topical, PRN, Arabella MerlesKimberly D Shaw, CNM .  witch hazel-glycerin (TUCKS) pad 1 application, 1 application, Topical, PRN **AND** dibucaine (NUPERCAINAL) 1 % rectal ointment 1 application, 1 application, Rectal, PRN, Arabella MerlesKimberly D Shaw, CNM .  diphenhydrAMINE (BENADRYL) capsule 25 mg, 25 mg, Oral, Q6H PRN, Arabella MerlesKimberly D Shaw, CNM .  ibuprofen (ADVIL,MOTRIN) tablet 600 mg, 600 mg,  Oral, 4 times per day, Arabella Merles, CNM, 600 mg at 11/16/14 0549 .  lanolin ointment, , Topical, PRN, Arabella Merles, CNM .  ondansetron (ZOFRAN) tablet 4 mg, 4 mg, Oral, Q4H PRN **OR** ondansetron (ZOFRAN) injection 4 mg, 4 mg, Intravenous, Q4H PRN, Arabella Merles, CNM .  prenatal multivitamin tablet 1 tablet, 1 tablet, Oral, Q1200, Arabella Merles, CNM, 1 tablet at 11/15/14 1212 .  senna-docusate (Senokot-S) tablet 2 tablet, 2 tablet, Oral, Q24H, Arabella Merles, CNM, 2 tablet at 11/15/14 2305 .  simethicone (MYLICON) chewable tablet 80 mg, 80 mg, Oral, PRN, Arabella Merles, CNM .  zolpidem (AMBIEN) tablet 5 mg, 5 mg, Oral, QHS PRN, Arabella Merles, CNM After visit meds:    Medication List    STOP taking these medications        CALCIUM 500 PO     IRON PO     vitamin C 250 MG tablet  Commonly known as:  ASCORBIC ACID      TAKE these medications        acetaminophen 325 MG tablet  Commonly known as:  TYLENOL  Take 650 mg by mouth every 6 (six) hours as needed for moderate pain.     ibuprofen 600  MG tablet  Commonly known as:  ADVIL,MOTRIN  Take 1 tablet (600 mg total) by mouth every 6 (six) hours.     multivitamin-prenatal 27-0.8 MG Tabs tablet  Take 1 tablet by mouth daily at 12 noon.        Diet: routine diet  Activity: Advance as tolerated. Pelvic rest for 6 weeks.   Outpatient follow up:6 weeks Follow up Appt:Future Appointments Date Time Provider Department Center  12/29/2014 2:50 PM Marny Lowenstein, PA-C [facility]   Follow up Visit: 6 weeks   Postpartum contraception: IUD Mirena (likely, still considering)  Newborn Data: Live born female  Birth Weight: 7 lb 10.9 oz (3485 g) APGAR: 6, 8  Baby Feeding: Breast Disposition: infant to stay one more day given GBS inadequate treatment, mother to room-in   11/16/2014 Silvano Bilis, MD

## 2014-11-16 NOTE — Lactation Note (Signed)
This note was copied from the chart of Donna Edwin DadaMelissa Stevens. Lactation Consultation Note  Patient Name: Donna Edwin DadaMelissa Donna Stevens ZOXWR'UToday's Date: 11/16/2014 Reason for consult: Follow-up assessment Baby was latched upon entry. Baby at 40 hr of life and mom reports bf is going well. She has no nipple pain or breast sore despite baby cluster feeding today. No questions or concerns at this time. She will call as needed for bf help.   Maternal Data    Feeding Feeding Type: Breast Fed Length of feed: 15 min  LATCH Score/Interventions                      Lactation Tools Discussed/Used     Consult Status Consult Status: PRN Date: 11/17/14 Follow-up type: In-patient    Rulon Eisenmengerlizabeth E Charlina Dwight 11/16/2014, 10:03 PM

## 2014-11-16 NOTE — Discharge Instructions (Signed)

## 2014-11-17 ENCOUNTER — Ambulatory Visit: Payer: Self-pay

## 2014-11-17 NOTE — Lactation Note (Signed)
This note was copied from the chart of Girl Edwin DadaMelissa Ellerman. Lactation Consultation Note  Patient Name: Girl Edwin DadaMelissa Colston ZOXWR'UToday's Date: 11/17/2014 Reason for consult: Follow-up assessment;Infant weight loss  Baby is 50 hours old - 8% weight loss, ( 7.0.5 oz ) ,  Breast feeding range =10-60 mins , per mom baby has been cluster feeding since 5 am. @ 45 hours - Bili 4.7 /. Per mom will have a F/U with Dr. Donnie Coffinubin - this Thursday .  Mom denies sore nipples. Sore nipple and engorgement prevention and tx reviewed.  LC instructed mom on the use hand pump.  Mom independently latched the baby - latch score 10 - multiply swallows noted.  Mother informed of post-discharge support and given phone number to the lactation department,  including services for phone call assistance; out-patient appointments; and breastfeeding support group.  List of other breastfeeding resources in the community given in the handout. Encouraged mother to call for  problems or concerns related to breastfeeding.  Maternal Data    Feeding Baby still feeding  With  Multiply swallows noted   LATCH Score/Interventions Latch: Grasps breast easily, tongue down, lips flanged, rhythmical sucking.  Audible Swallowing: Spontaneous and intermittent  Type of Nipple: Everted at rest and after stimulation  Comfort (Breast/Nipple): Soft / non-tender     Hold (Positioning): No assistance needed to correctly position infant at breast.  LATCH Score: 10  Lactation Tools Discussed/Used     Consult Status Consult Status: Complete Date: 11/17/14    Kathrin Greathouseorio, Diogo Anne Ann 11/17/2014, 8:58 AM

## 2014-11-17 NOTE — Lactation Note (Signed)
This note was copied from the chart of Donna Stevens. Lactation Consultation Note Had a 8% weight loss in 43 hrs. Baby has been BF well and large out put. Baby has had 7 voids and 6 stools. Long BF charted w/Latch score 9 and 10. Patient Name: Donna Stevens Today's Date: 11/17/2014     Maternal Data    Feeding Feeding Type: Breast Fed Length of feed: 5 min  LATCH Score/Interventions Latch: Grasps breast easily, tongue down, lips flanged, rhythmical sucking.  Audible Swallowing: Spontaneous and intermittent  Type of Nipple: Everted at rest and after stimulation  Comfort (Breast/Nipple): Soft / non-tender     Hold (Positioning): No assistance needed to correctly position infant at breast.  LATCH Score: 10  Lactation Tools Discussed/Used     Consult Status      Charyl DancerCARVER, Treena Cosman G 11/17/2014, 7:43 AM

## 2014-11-18 ENCOUNTER — Inpatient Hospital Stay (HOSPITAL_COMMUNITY): Admission: RE | Admit: 2014-11-18 | Payer: Self-pay | Source: Ambulatory Visit

## 2014-12-29 ENCOUNTER — Ambulatory Visit (INDEPENDENT_AMBULATORY_CARE_PROVIDER_SITE_OTHER): Payer: Medicaid Other | Admitting: Medical

## 2014-12-29 ENCOUNTER — Encounter: Payer: Self-pay | Admitting: Medical

## 2014-12-29 DIAGNOSIS — O34219 Maternal care for unspecified type scar from previous cesarean delivery: Secondary | ICD-10-CM | POA: Insufficient documentation

## 2014-12-29 DIAGNOSIS — Z3009 Encounter for other general counseling and advice on contraception: Secondary | ICD-10-CM

## 2014-12-29 DIAGNOSIS — Z98891 History of uterine scar from previous surgery: Secondary | ICD-10-CM | POA: Insufficient documentation

## 2014-12-29 MED ORDER — NORETHINDRONE 0.35 MG PO TABS: 1.0000 | ORAL_TABLET | Freq: Every day | ORAL | Status: DC

## 2014-12-29 NOTE — Patient Instructions (Addendum)
Postpartum Care After Vaginal Delivery After you deliver your newborn (postpartum period), the usual stay in the hospital is 24-72 hours. If there were problems with your labor or delivery, or if you have other medical problems, you might be in the hospital longer.  While you are in the hospital, you will receive help and instructions on how to care for yourself and your newborn during the postpartum period.  While you are in the hospital:  Be sure to tell your nurses if you have pain or discomfort, as well as where you feel the pain and what makes the pain worse.  If you had an incision made near your vagina (episiotomy) or if you had some tearing during delivery, the nurses may put ice packs on your episiotomy or tear. The ice packs may help to reduce the pain and swelling.  If you are breastfeeding, you may feel uncomfortable contractions of your uterus for a couple of weeks. This is normal. The contractions help your uterus get back to normal size.  It is normal to have some bleeding after delivery.  For the first 1-3 days after delivery, the flow is red and the amount may be similar to a period.  It is common for the flow to start and stop.  In the first few days, you may pass some small clots. Let your nurses know if you begin to pass large clots or your flow increases.  Do not  flush blood clots down the toilet before having the nurse look at them.  During the next 3-10 days after delivery, your flow should become more watery and pink or brown-tinged in color.  Ten to fourteen days after delivery, your flow should be a small amount of yellowish-white discharge.  The amount of your flow will decrease over the first few weeks after delivery. Your flow may stop in 6-8 weeks. Most women have had their flow stop by 12 weeks after delivery.  You should change your sanitary pads frequently.  Wash your hands thoroughly with soap and water for at least 20 seconds after changing pads, using  the toilet, or before holding or feeding your newborn.  You should feel like you need to empty your bladder within the first 6-8 hours after delivery.  In case you become weak, lightheaded, or faint, call your nurse before you get out of bed for the first time and before you take a shower for the first time.  Within the first few days after delivery, your breasts may begin to feel tender and full. This is called engorgement. Breast tenderness usually goes away within 48-72 hours after engorgement occurs. You may also notice milk leaking from your breasts. If you are not breastfeeding, do not stimulate your breasts. Breast stimulation can make your breasts produce more milk.  Spending as much time as possible with your newborn is very important. During this time, you and your newborn can feel close and get to know each other. Having your newborn stay in your room (rooming in) will help to strengthen the bond with your newborn. It will give you time to get to know your newborn and become comfortable caring for your newborn.  Your hormones change after delivery. Sometimes the hormone changes can temporarily cause you to feel sad or tearful. These feelings should not last more than a few days. If these feelings last longer than that, you should talk to your caregiver.  If desired, talk to your caregiver about methods of family planning or contraception.  Talk to your caregiver about immunizations. Your caregiver may want you to have the following immunizations before leaving the hospital:  Tetanus, diphtheria, and pertussis (Tdap) or tetanus and diphtheria (Td) immunization. It is very important that you and your family (including grandparents) or others caring for your newborn are up-to-date with the Tdap or Td immunizations. The Tdap or Td immunization can help protect your newborn from getting ill.  Rubella immunization.  Varicella (chickenpox) immunization.  Influenza immunization. You should  receive this annual immunization if you did not receive the immunization during your pregnancy.   This information is not intended to replace advice given to you by your health care provider. Make sure you discuss any questions you have with your health care provider.   Document Released: 11/07/2006 Document Revised: 10/05/2011 Document Reviewed: 09/07/2011 Elsevier Interactive Patient Education 2016 [employer] Oral Contraception Information Oral contraceptive pills (OCPs) are medicines taken to prevent pregnancy. OCPs work by preventing the ovaries from releasing eggs. The hormones in OCPs also cause the cervical mucus to thicken, preventing the sperm from entering the uterus. The hormones also cause the uterine lining to become thin, not allowing a fertilized egg to attach to the inside of the uterus. OCPs are highly effective when taken exactly as prescribed. However, OCPs do not prevent sexually transmitted diseases (STDs). Safe sex practices, such as using condoms along with the pill, can help prevent STDs.  Before taking the pill, you may have a physical exam and Pap test. Your health care provider may order blood tests. The health care provider will make sure you are a good candidate for oral contraception. Discuss with your health care provider the possible side effects of the OCP you may be prescribed. When starting an OCP, it can take 2 to 3 months for the body to adjust to the changes in hormone levels in your body.  TYPES OF ORAL CONTRACEPTION The combination pill--This pill contains estrogen and progestin (synthetic progesterone) hormones. The combination pill comes in 21-day, 28-day, or 91-day packs. Some types of combination pills are meant to be taken continuously (365-day pills). With 21-day packs, you do not take pills for 7 days after the last pill. With 28-day packs, the pill is taken every day. The last 7 pills are without hormones. Certain types of pills have more than 21  hormone-containing pills. With 91-day packs, the first 84 pills contain both hormones, and the last 7 pills contain no hormones or contain estrogen only. The minipill--This pill contains the progesterone hormone only. The pill is taken every day continuously. It is very important to take the pill at the same time each day. The minipill comes in packs of 28 pills. All 28 pills contain the hormone.  ADVANTAGES OF ORAL CONTRACEPTIVE PILLS Decreases premenstrual symptoms.  Treats menstrual period cramps.  Regulates the menstrual cycle.  Decreases a heavy menstrual flow.  May treatacne, depending on the type of pill.  Treats abnormal uterine bleeding.  Treats polycystic ovarian syndrome.  Treats endometriosis.  Can be used as emergency contraception.  THINGS THAT CAN MAKE ORAL CONTRACEPTIVE PILLS LESS EFFECTIVE OCPs can be less effective if:  You forget to take the pill at the same time every day.  You have a stomach or intestinal disease that lessens the absorption of the pill.  You take OCPs with other medicines that make OCPs less effective, such as antibiotics, certain HIV medicines, and some seizure medicines.  You take expired OCPs.  You forget to restart the pill on  day 7, when using the packs of 21 pills.  RISKS ASSOCIATED WITH ORAL CONTRACEPTIVE PILLS  Oral contraceptive pills can sometimes cause side effects, such as: Headache. Nausea. Breast tenderness. Irregular bleeding or spotting. Combination pills are also associated with a small increased risk of: Blood clots. Heart attack. Stroke.   This information is not intended to replace advice given to you by your health care provider. Make sure you discuss any questions you have with your health care provider.   Document Released: 04/02/2002 Document Revised: 10/31/2012 Document Reviewed: 07/01/2012 Elsevier Interactive Patient Education [employer]

## 2014-12-29 NOTE — Progress Notes (Deleted)
Patient ID: Donna RushingMelissa Ann Stevens, female   DOB: 07/22/1975, 39 y.o.   MRN: [id number] Subjective:     Donna Stevens is a 39 y.o. female who presents for a postpartum visit. She is 6 weeks postpartum following a VBAC. I have fully reviewed the prenatal and intrapartum course. The delivery was at 40.5 gestational weeks. Outcome: vaginal birth after cesarean (VBAC). Anesthesia: epidural. Postpartum course has been ***. Baby's course has been ***. Baby is feeding by {breast/bottle:69}. Bleeding {vag bleed:12292}. Bowel function is {normal:32111}. Bladder function is {normal:32111}. Patient {is/is not:9024} sexually active. Contraception method is {contraceptive method:5051}. Postpartum depression screening: negative.  The following portions of the patient's history were reviewed and updated as appropriate: allergies, current medications, past family history, past medical history, past social history, past surgical history and problem list.  Review of Systems Pertinent items are noted in HPI.   Objective:    BP 137/59 mmHg  Pulse 99  Temp(Src) 98.3 F (36.8 C) (Oral)  Wt 236 lb 6.4 oz (107.23 kg)  Breastfeeding? Yes  General:  {gen appearance:16600}   Breasts:  {breast exam:1202::"inspection negative, no nipple discharge or bleeding, no masses or nodularity palpable"}  Lungs: {lung exam:16931}  Heart:  {heart exam:5510}  Abdomen: {abdomen exam:16834}   Vulva:  {labia exam:12198}  Vagina: {vagina exam:12200}  Cervix:  {cervix exam:14595}  Corpus: {uterus exam:12215}  Adnexa:  {adnexa exam:12223}  Rectal Exam: {rectal/vaginal exam:12274}        Assessment:    *** postpartum exam. Pap smear not done at today's visit.  Last pap smear was negative 06/2014  Plan:    1. Contraception: {method:5051} 2. *** 3. Follow up in: {1-10:13787} {time; units:19136} or as needed.    Marny LowensteinJulie N Talya Quain, PA-C 12/29/2014 3:31 PM

## 2014-12-29 NOTE — Progress Notes (Signed)
Patient ID: Donna RushingMelissa Ann Shearman, female   DOB: 11/17/1975, 39 y.o.   MRN: [id number] Subjective:     Donna RushingMelissa Ann Feld is a 39 y.o. female who presents for a postpartum visit. She is 6 weeks postpartum following a spontaneous vaginal delivery. I have fully reviewed the prenatal and intrapartum course. The delivery was at 41 gestational weeks. Outcome: spontaneous vaginal delivery. Anesthesia: epidural. Postpartum course has been unremarkable. Baby's course has been unremarkable. Baby is feeding by breast. Bleeding no bleeding. Bowel function is normal. Bladder function is normal. Patient is not sexually active. Contraception method is undecided. Postpartum depression screening: negative.  The following portions of the patient's history were reviewed and updated as appropriate: allergies, current medications, past family history, past medical history, past social history, past surgical history and problem list.  Review of Systems Pertinent items are noted in HPI.   Objective:    BP 137/59 mmHg  Pulse 99  Temp(Src) 98.3 F (36.8 C) (Oral)  Wt 236 lb 6.4 oz (107.23 kg)  Breastfeeding? Yes  General:  alert and cooperative   Breasts:  not performed  Lungs: clear to auscultation bilaterally  Heart:  regular rate and rhythm, S1, S2 normal, no murmur, click, rub or gallop  Abdomen: soft, non-tender; bowel sounds normal; no masses,  no organomegaly   Vulva:  not evaluated  Vagina: not evaluated  Cervix:  not evaluated  Corpus: not examined  Adnexa:  not evaluated  Rectal Exam: Not performed.        Assessment:     Normal postpartum exam. Pap smear not done at today's visit. Last pap smear was negative 06/2014  Plan:    1. Contraception: oral progesterone-only contraceptive. Advised of need for condom use for at least first month.  2. Follow up in: 1 year for annual exam or sooner as needed.    Marny LowensteinJulie N Elinor Kleine, PA-C  12/29/2014 3:42 PM

## 2015-10-29 ENCOUNTER — Other Ambulatory Visit: Payer: Self-pay | Admitting: Medical

## 2015-10-29 DIAGNOSIS — Z3009 Encounter for other general counseling and advice on contraception: Secondary | ICD-10-CM

## 2015-10-31 ENCOUNTER — Encounter (HOSPITAL_COMMUNITY): Payer: Self-pay

## 2015-10-31 ENCOUNTER — Emergency Department (HOSPITAL_COMMUNITY): Payer: Self-pay

## 2015-10-31 ENCOUNTER — Emergency Department (HOSPITAL_COMMUNITY)
Admission: EM | Admit: 2015-10-31 | Discharge: 2015-10-31 | Disposition: A | Discharge status: Home / Self Care | Payer: Self-pay | Attending: Emergency Medicine | Admitting: Emergency Medicine

## 2015-10-31 DIAGNOSIS — F419 Anxiety disorder, unspecified: Secondary | ICD-10-CM | POA: Insufficient documentation

## 2015-10-31 DIAGNOSIS — Z87891 Personal history of nicotine dependence: Secondary | ICD-10-CM | POA: Insufficient documentation

## 2015-10-31 DIAGNOSIS — R079 Chest pain, unspecified: Secondary | ICD-10-CM

## 2015-10-31 DIAGNOSIS — R0789 Other chest pain: Secondary | ICD-10-CM | POA: Insufficient documentation

## 2015-10-31 LAB — BASIC METABOLIC PANEL
ANION GAP: 8 (ref 5–15)
BUN: 11 mg/dL (ref 6–20)
CHLORIDE: 106 mmol/L (ref 101–111)
CO2: 22 mmol/L (ref 22–32)
Calcium: 9.8 mg/dL (ref 8.9–10.3)
Creatinine, Ser: 0.73 mg/dL (ref 0.44–1.00)
GFR calc Af Amer: >60 mL/min (ref >60)
GLUCOSE: 123 mg/dL — AB (ref 65–99)
POTASSIUM: 3.3 mmol/L — AB (ref 3.5–5.1)
Sodium: 136 mmol/L (ref 135–145)

## 2015-10-31 LAB — I-STAT TROPONIN, ED: Troponin i, poc: 0 ng/mL (ref 0.00–0.08)

## 2015-10-31 LAB — CBC
HEMATOCRIT: 42.7 % (ref 36.0–46.0)
HEMOGLOBIN: 14.1 g/dL (ref 12.0–15.0)
MCH: 32.1 pg (ref 26.0–34.0)
MCHC: 33 g/dL (ref 30.0–36.0)
MCV: 97.3 fL (ref 78.0–100.0)
Platelets: 280 10*3/uL (ref 150–400)
RBC: 4.39 MIL/uL (ref 3.87–5.11)
RDW: 13.5 % (ref 11.5–15.5)
WBC: 10.3 10*3/uL (ref 4.0–10.5)

## 2015-10-31 LAB — D-DIMER, QUANTITATIVE (NOT AT ARMC): D DIMER QUANT: 0.82 ug{FEU}/mL — AB (ref 0.00–0.50)

## 2015-10-31 MED ORDER — IOPAMIDOL (ISOVUE-370) INJECTION 76%
100.0000 mL | Freq: Once | INTRAVENOUS | Status: AC | PRN
  Administered 2015-10-31: 100 mL via INTRAVENOUS

## 2015-10-31 MED ORDER — LORAZEPAM 1 MG PO TABS
1.0000 mg | ORAL_TABLET | Freq: Once | ORAL | Status: AC
  Administered 2015-10-31: 1 mg via ORAL
  Filled 2015-10-31: qty 1

## 2015-10-31 NOTE — ED Triage Notes (Signed)
Pt states that this evening after eating dinner, she began experiencing centralized chest pain. She is complaining of SOB, dizziness, and dry mouth as well. She states that the pain feels like pressure or heaviness. She also mentions that she feels like she is having a panic attack. A&Ox4. Ambulatory.

## 2015-10-31 NOTE — ED Notes (Signed)
No respiratory or acute distress noted alert and oriented x 3 call light in reach no reaction to medication noted. 

## 2015-10-31 NOTE — ED Provider Notes (Signed)
[facility]-EMERGENCY DEPT Provider Note   CSN: [id number] Arrival date & time: 10/31/15  1956     History   Chief Complaint Chief Complaint  Patient presents with  . Chest Pain    HPI Donna Stevens is a 40 y.o. female.  Patient presents today with a chief complaint of chest pain.  She reports onset of pain around 6:30 PM tonight and states that the pain has been constant since that time.  She was eating dinner with her family at the onset of pain.  She was not doing anything exertional.  Pain has improved somewhat from the onset of pain.  She describes the pain as a pressure.  Pain does not radiate.  Pain is worse when taking deep breath.  She has not taken anything for pain prior to arrival.  She reports that she feels that she is having a panic attack, but denies any history of panic attacks.  She states that nothing in particular made her feel more anxious or stressed at dinner.  She denies any prior cardiac history.  Denies history of HTN, Hyperlipidemia, or DM.  She states that she stopped smoking 1 month ago.  She states that she is on Ortho Micronor for birth control, which is progesterone.  She denies prolonged travel or surgeries in the past month.  Denies LE edema, erythema, or pain.      Past Medical History:  Diagnosis Date  . Former smoker    Quit 05/2014  . Kidney stone   . Maternal obesity syndrome, antepartum   . Previous cesarean delivery, antepartum condition or complication     Patient Active Problem List   Diagnosis Date Noted  . S/P cesarean section 12/29/2014  . VBAC, delivered 12/29/2014    Past Surgical History:  Procedure Laterality Date  . CESAREAN SECTION  1996  . NEUROPLASTY / TRANSPOSITION MEDIAN NERVE AT CARPAL TUNNEL  2006    OB History    Gravida Para Term Preterm AB Living   4 3 3  0 1 2   SAB TAB Ectopic Multiple Live Births   1 0 0 0 2       Home Medications    Prior to Admission medications   Medication Sig Start Date End Date  Taking? Authorizing Provider  acetaminophen (TYLENOL) 325 MG tablet Take 650 mg by mouth every 6 (six) hours as needed for moderate pain.    Historical Provider, MD  ibuprofen (ADVIL,MOTRIN) 600 MG tablet Take 1 tablet (600 mg total) by mouth every 6 (six) hours. Patient not taking: Reported on 12/29/2014 11/16/14   Kathrynn RunningNoah Bedford Wouk, MD  norethindrone (ORTHO MICRONOR) 0.35 MG tablet Take 1 tablet (0.35 mg total) by mouth daily. 12/29/14   Marny LowensteinJulie N Wenzel, PA-C  Prenatal Vit-Fe Fumarate-FA (MULTIVITAMIN-PRENATAL) 27-0.8 MG TABS tablet Take 1 tablet by mouth daily at 12 noon. 07/17/14   Fredirick LatheKristy Acosta, MD    Family History Family History  Problem Relation Age of Onset  . Hepatitis Mother     Social History Social History  Substance Use Topics  . Smoking status: Former Smoker    Packs/day: 1.00    Years: 10.00    Quit date: 05/25/2014  . Smokeless tobacco: Never Used  . Alcohol use No     Allergies   Codeine   Review of Systems Review of Systems  All other systems reviewed and are negative.    Physical Exam Updated Vital Signs BP 160/89 (BP Location: Right Arm)   Pulse (!) 126  Temp 97.5 F (36.4 C) (Oral)   Resp 15   Ht 5\' 5"  (1.651 m)   Wt 94.8 kg   LMP 10/31/2015   SpO2 100%   BMI 34.78 kg/m   Physical Exam  Constitutional: She appears well-developed and well-nourished.  HENT:  Head: Normocephalic and atraumatic.  Mouth/Throat: Oropharynx is clear and moist.  Neck: Normal range of motion. Neck supple.  Cardiovascular: Normal rate, regular rhythm and normal heart sounds.   Pulmonary/Chest: Effort normal and breath sounds normal. No respiratory distress. She has no wheezes. She has no rales. She exhibits no tenderness.  Musculoskeletal: Normal range of motion.  No LE edema or erythema  Neurological: She is alert.  Skin: Skin is warm and dry.  Psychiatric: Her mood appears anxious.  Nursing note and vitals reviewed.    ED Treatments / Results  Labs (all  labs ordered are listed, but only abnormal results are displayed) Labs Reviewed  BASIC METABOLIC PANEL  CBC  I-STAT TROPOININ, ED    EKG  EKG Interpretation None       Radiology Dg Chest 2 View  Result Date: 10/31/2015 CLINICAL DATA:  Initial evaluation for acute chest pain. EXAM: CHEST  2 VIEW COMPARISON:  None available. FINDINGS: Cardiac and mediastinal silhouettes are within normal limits. Mole lungs are mildly hypoinflated. Mild bibasilar subsegmental atelectasis. No focal infiltrates. No pulmonary edema or pleural effusion. No pneumothorax. No acute osseous abnormality. IMPRESSION: Shallow lung inflation with mild bibasilar subsegmental atelectasis. No other active cardiopulmonary disease identified. Electronically Signed   By: Rise Mu M.D.   On: 10/31/2015 21:03   Ct Angio Chest Pe W Or Wo Contrast  Result Date: 10/31/2015 CLINICAL DATA:  Chest pain.  Shortness of breath. EXAM: CT ANGIOGRAPHY CHEST WITH CONTRAST TECHNIQUE: Multidetector CT imaging of the chest was performed using the standard protocol during bolus administration of intravenous contrast. Multiplanar CT image reconstructions and MIPs were obtained to evaluate the vascular anatomy. CONTRAST:  100 mL of Isovue 370 COMPARISON:  None. FINDINGS: Cardiovascular: Evaluation for pulmonary emboli is limited due to respiratory motion and stairstep artifact. Within this limitation, no PE are identified. The heart size is normal. Mediastinum/Nodes: No enlarged mediastinal, hilar, or axillary lymph nodes. Thyroid gland, trachea, and esophagus demonstrate no significant findings. Lungs/Pleura: Lungs are clear. No pleural effusion or pneumothorax. Upper Abdomen: No acute abnormality. Musculoskeletal: No chest wall abnormality. No acute or significant osseous findings. Review of the MIP images confirms the above findings. IMPRESSION: 1. Evaluation for pulmonary emboli is somewhat limited as above. However, no pulmonary emboli  identified. No acute abnormalities. Electronically Signed   By: Gerome Sam III M.D   On: 10/31/2015 23:34    Procedures Procedures (including critical care time)  Medications Ordered in ED Medications - No data to display   Initial Impression / Assessment and Plan / ED Course  I have reviewed the triage vital signs and the nursing notes.  Pertinent labs & imaging results that were available during my care of the patient were reviewed by me and considered in my medical decision making (see chart for details).  Clinical Course   11:44 PM Reassessed patient.  She denies any CP or SOB at this time.    Final Clinical Impressions(s) / ED Diagnoses   Final diagnoses:  None   Patient presents today with a chief complaint of chest pain that she began having earlier this evening while at dinner with her family.  She was not doing anything exertional at the  onset of pain.  Upon arrival in the ED patient noted to be tachycardic, but also appeared anxious.  No ischemic changes on EKG.  Troponin is negative.  CXR also negative.  HEART score of 2, putting her at low risk.  Patient found to have an elevated d-dimer.  CT angio chest not showing evidence of PE.  Reassessed patient after Ativan and the chest pain and SOB had resolved.  Tachycardia also resolved.  Feel that the patient is stable for discharge.  Return precautions given.    New Prescriptions New Prescriptions   No medications on file     Santiago Glad, PA-C 11/01/15 2251    Azalia Bilis, MD 11/02/15 [phone]

## 2015-11-03 ENCOUNTER — Telehealth: Payer: Self-pay | Admitting: *Deleted

## 2015-11-03 NOTE — Telephone Encounter (Addendum)
Pt left message stating that she needs refill of her birth control pills.  She took her last pill today.   11/17  I observed today that this call had not been addressed. I called pt and apologized for the delay in returning her call.  Pt was not upset and stated that she had gone to an on-line site and was able to obtain a year's worth prescription for birth control pills (Junel). She does not have insurance at this time and the site seemed reputable as well as the pills are in appropriately sealed package for the manufacturer. I advised pt that she will need Pap after 6/18 and she may obtain the test from one of the free [facility] Screening opportunities [facility]. Pt voiced understanding of all information given and expressed gratitude for my call.

## 2015-12-07 NOTE — Telephone Encounter (Signed)
Patient will need appointment before any additional refills will be given

## 2016-03-11 ENCOUNTER — Other Ambulatory Visit: Payer: Self-pay | Admitting: Medical

## 2016-03-11 DIAGNOSIS — Z3009 Encounter for other general counseling and advice on contraception: Secondary | ICD-10-CM

## 2017-11-30 ENCOUNTER — Telehealth: Payer: Self-pay | Admitting: Family Medicine

## 2017-11-30 DIAGNOSIS — J019 Acute sinusitis, unspecified: Secondary | ICD-10-CM

## 2017-11-30 MED ORDER — AMOXICILLIN-POT CLAVULANATE 875-125 MG PO TABS: 1.0000 | ORAL_TABLET | Freq: Two times a day (BID) | ORAL | 0 refills | Status: AC

## 2017-11-30 NOTE — Progress Notes (Signed)

## 2018-03-21 ENCOUNTER — Telehealth: Payer: Self-pay | Admitting: Family

## 2018-03-21 DIAGNOSIS — B9789 Other viral agents as the cause of diseases classified elsewhere: Secondary | ICD-10-CM

## 2018-03-21 DIAGNOSIS — J329 Chronic sinusitis, unspecified: Secondary | ICD-10-CM

## 2018-03-21 MED ORDER — FLUTICASONE PROPIONATE 50 MCG/ACT NA SUSP: 2.0000 | Freq: Every day | NASAL | 6 refills | Status: AC

## 2018-03-21 NOTE — Progress Notes (Signed)
We are sorry that you are not feeling well.  Here is how we plan to help!  Based on what you have shared with me it looks like you have sinusitis.  Sinusitis is inflammation and infection in the sinus cavities of the head.  Based on your presentation I believe you most likely have Acute Viral Sinusitis.This is an infection most likely caused by a virus. There is not specific treatment for viral sinusitis other than to help you with the symptoms until the infection runs its course.  You may use an oral decongestant such as Mucinex D or if you have glaucoma or high blood pressure use plain Mucinex. Saline nasal spray help and can safely be used as often as needed for congestion, I have prescribed: Fluticasone nasal spray two sprays in each nostril once a day.  Approximately,  5 minutes spent reviewing and documenting in patient's chart.    Providers prescribe antibiotics to treat infections caused by bacteria. Antibiotics are very powerful in treating bacterial infections when they are used properly. To maintain their effectiveness, they should be used only when necessary. Overuse of antibiotics has resulted in the development of superbugs that are resistant to treatment!    After careful review of your answers and given the time your symptoms started, I would not recommend an antibiotic for your condition. Antibiotics are not effective against viruses and therefore should not be used to treat them. Common examples of infections caused by viruses include colds and flu.   Some authorities believe that zinc sprays or the use of Echinacea may shorten the course of your symptoms.  Sinus infections are not as easily transmitted as other respiratory infection, however we still recommend that you avoid close contact with loved ones, especially the very young and elderly.  Remember to wash your hands thoroughly throughout the day as this is the number one way to prevent the spread of infection!  Home  Care:  Only take medications as instructed by your medical team.  Do not take these medications with alcohol.  A steam or ultrasonic humidifier can help congestion.  You can place a towel over your head and breathe in the steam from hot water coming from a faucet.  Avoid close contacts especially the very young and the elderly.  Cover your mouth when you cough or sneeze.  Always remember to wash your hands.  Get Help Right Away If:  You develop worsening fever or sinus pain.  You develop a severe head ache or visual changes.  Your symptoms persist after you have completed your treatment plan.  Make sure you  Understand these instructions.  Will watch your condition.  Will get help right away if you are not doing well or get worse.  Your e-visit answers were reviewed by a board certified advanced clinical practitioner to complete your personal care plan.  Depending on the condition, your plan could have included both over the counter or prescription medications.  If there is a problem please reply  once you have received a response from your provider.  Your safety is important to [place].  If you have drug allergies check your prescription carefully.    You can use MyChart to ask questions about today's visit, request a non-urgent call back, or ask for a work or school excuse for 24 hours related to this e-Visit. If it has been greater than 24 hours you will need to follow up with your provider, or enter a new e-Visit to address those  concerns.  You will get an e-mail in the next two days asking about your experience.  I hope that your e-visit has been valuable and will speed your recovery. Thank you for using e-visits.

## 2018-07-10 IMAGING — CR DG CHEST 2V
2 series · 2 of 2 positions shown · non-contrast
Comparison: None available.

CLINICAL DATA: Initial evaluation for acute chest pain.

EXAM:
CHEST  2 VIEW

[w chest pa]
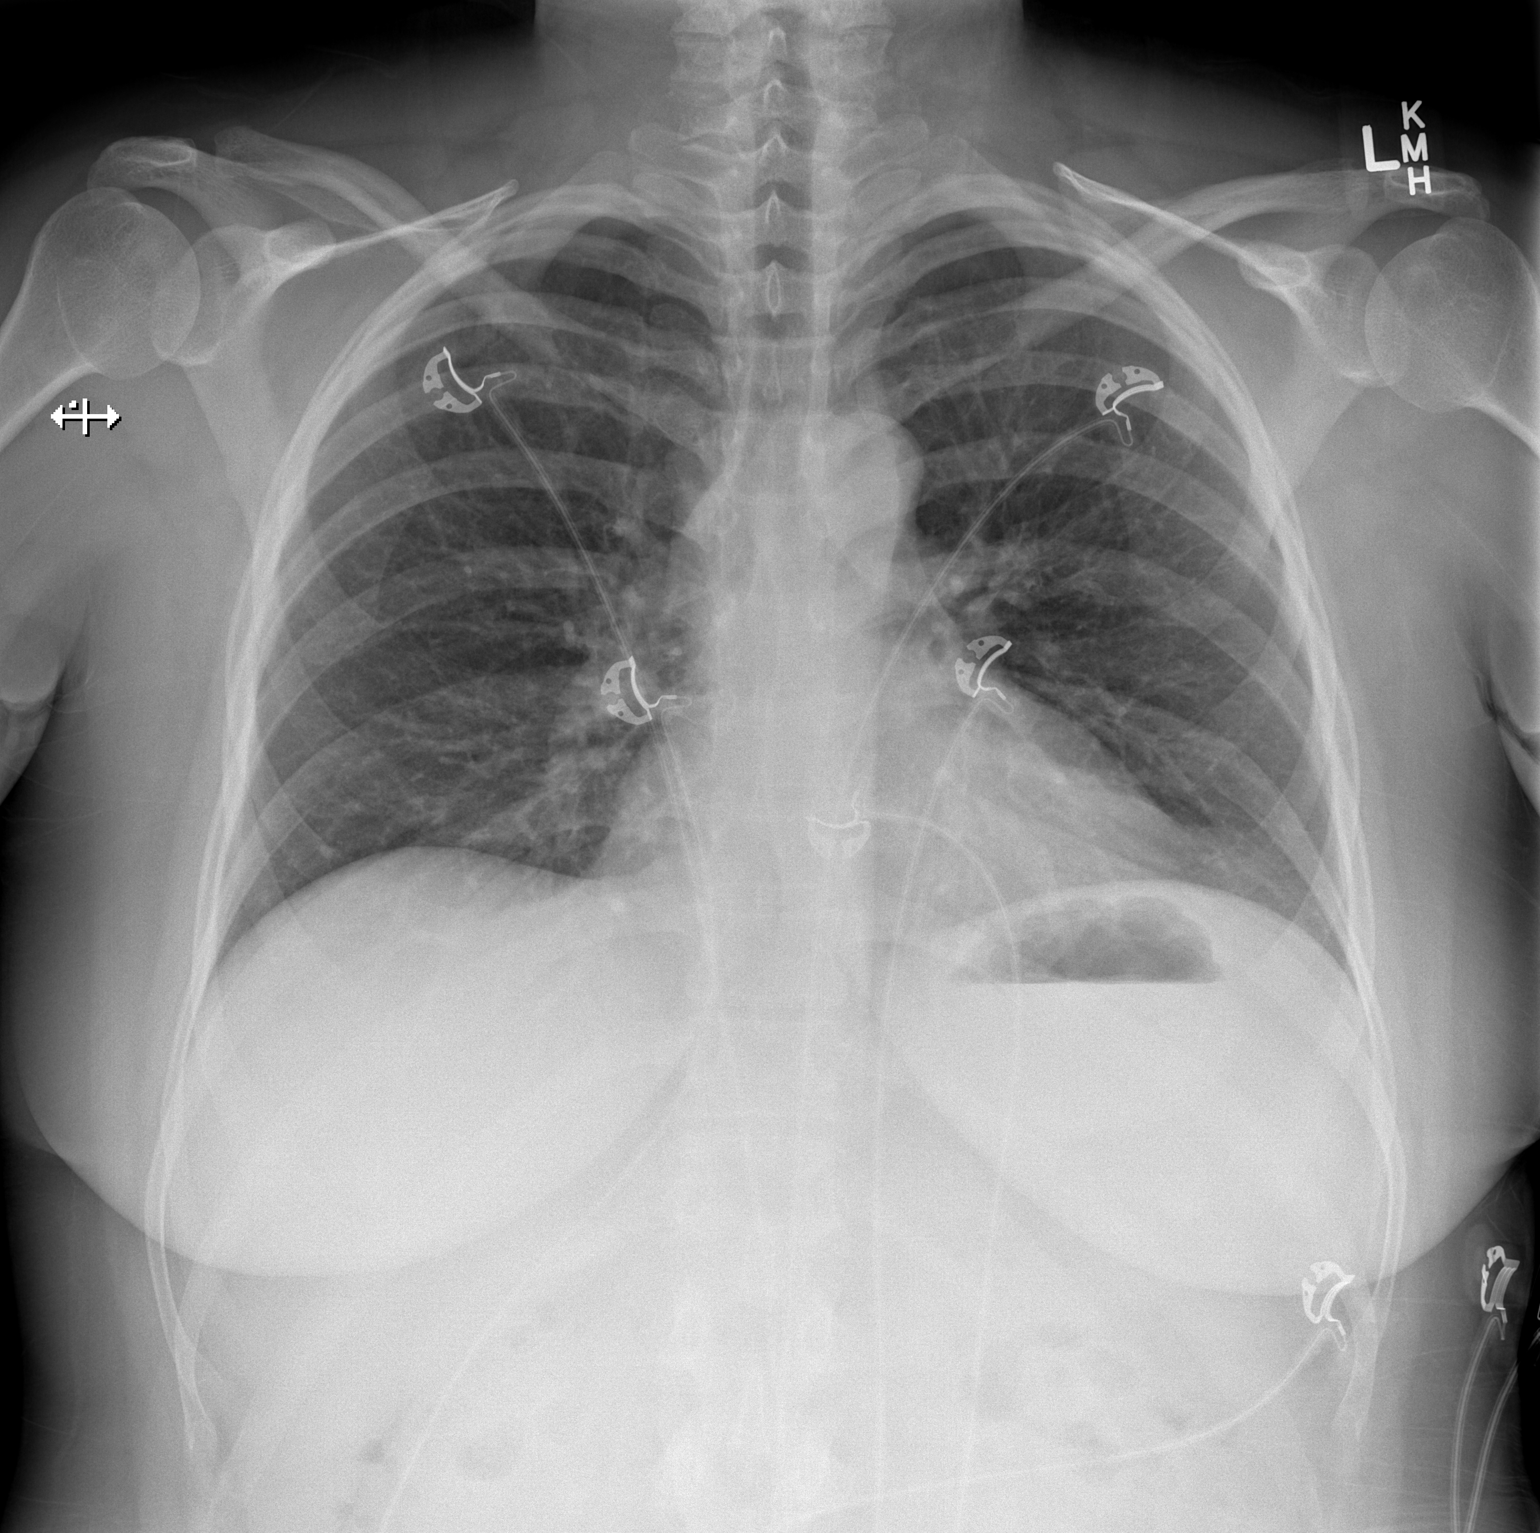

[w chest lat]
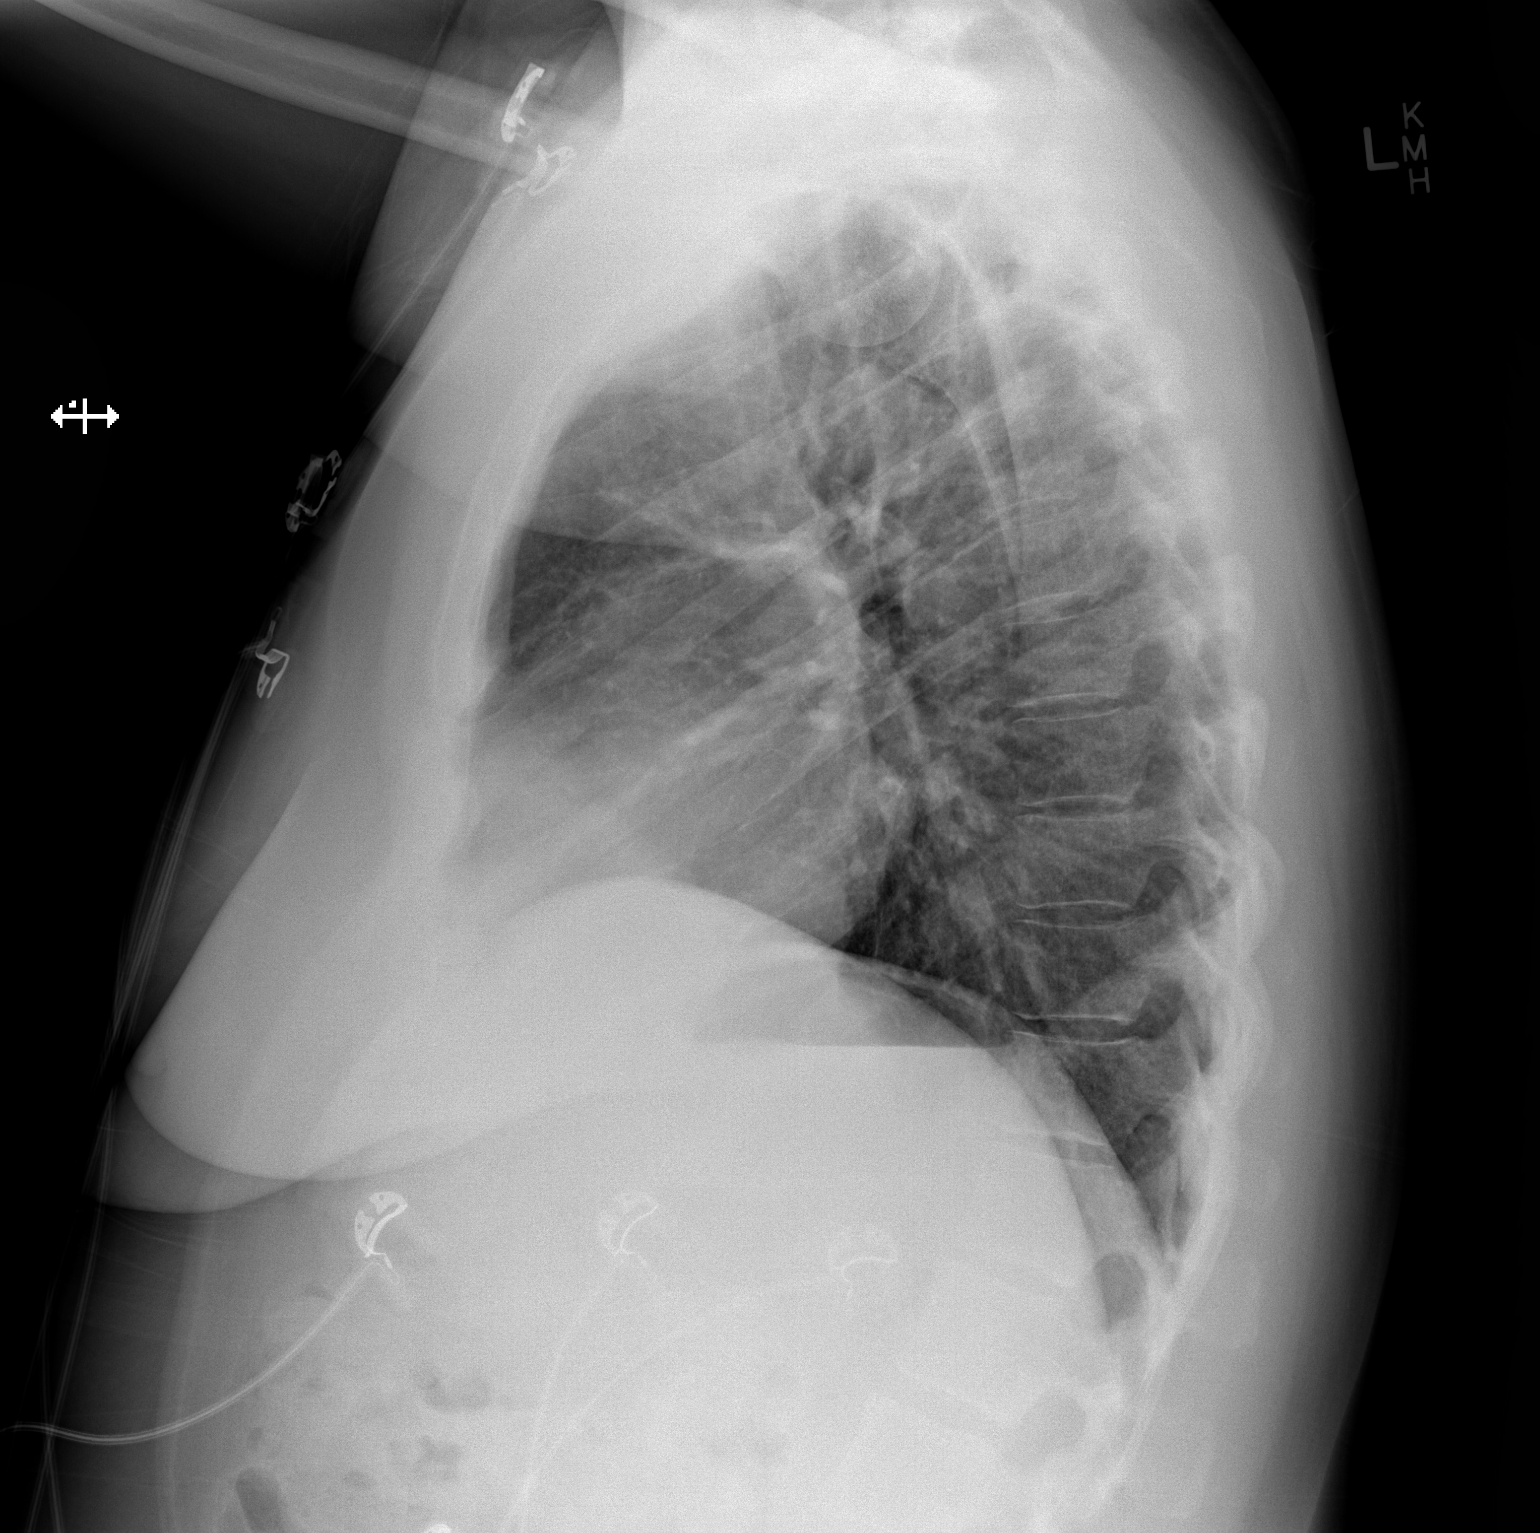

[2 of 2 positions shown; findings below may reference images not displayed]

FINDINGS: Cardiac and mediastinal silhouettes are within normal limits.

Mole lungs are mildly hypoinflated. Mild bibasilar subsegmental
atelectasis. No focal infiltrates. No pulmonary edema or pleural
effusion. No pneumothorax.

No acute osseous abnormality.
IMPRESSION: Shallow lung inflation with mild bibasilar subsegmental atelectasis.
No other active cardiopulmonary disease identified.

## 2018-07-10 IMAGING — CT CT ANGIO CHEST
3 of 7 series · 19 of 36 positions shown · IV contrast (ISOVUE 370)
Comparison: None.

CLINICAL DATA: Chest pain.  Shortness of breath.

EXAM:
CT ANGIOGRAPHY CHEST WITH CONTRAST
TECHNIQUE: Multidetector CT imaging of the chest was performed using the
standard protocol during bolus administration of intravenous
contrast. Multiplanar CT image reconstructions and MIPs were
obtained to evaluate the vascular anatomy.
CONTRAST:  100 mL of Isovue 370

[Series 6: thins for pacs · axial · 0.73mm/px · z∈[-264,-27]mm · 15 of 271 slices shown]
[im 17/271  lung]
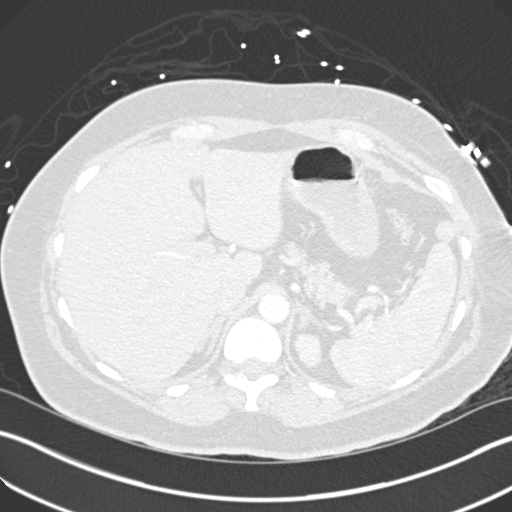
[im 34/271  mediastinal]
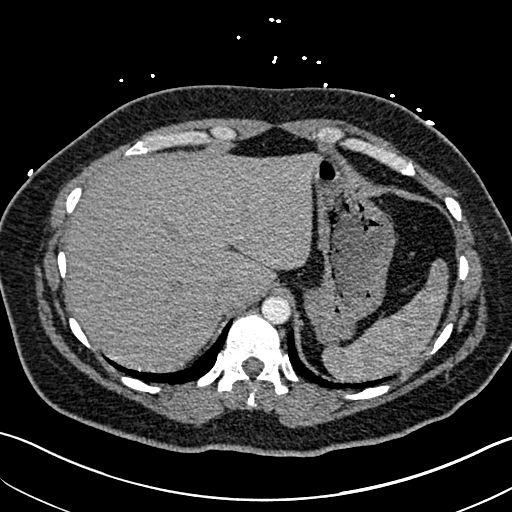
[im 51/271  lung]
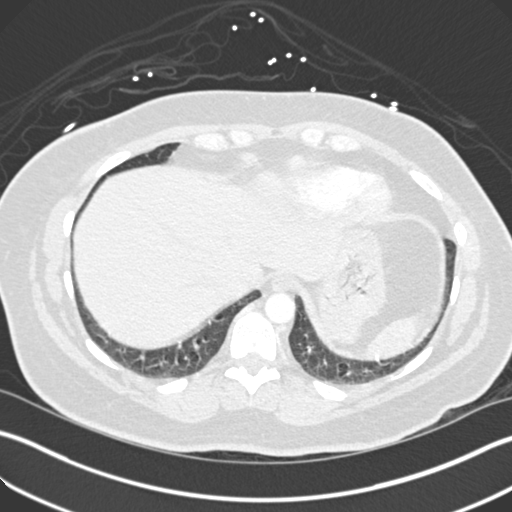
[im 68/271  mediastinal]
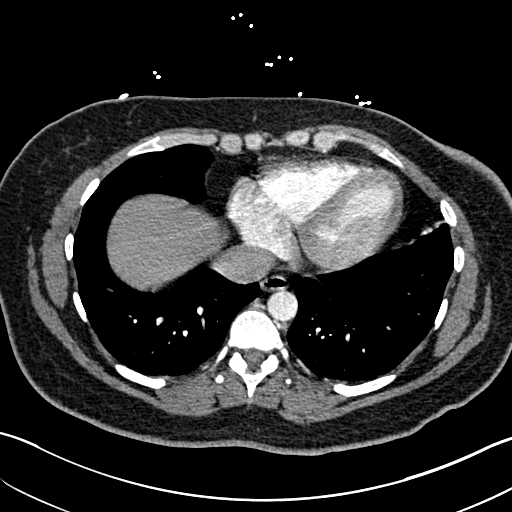
[im 85/271  lung]
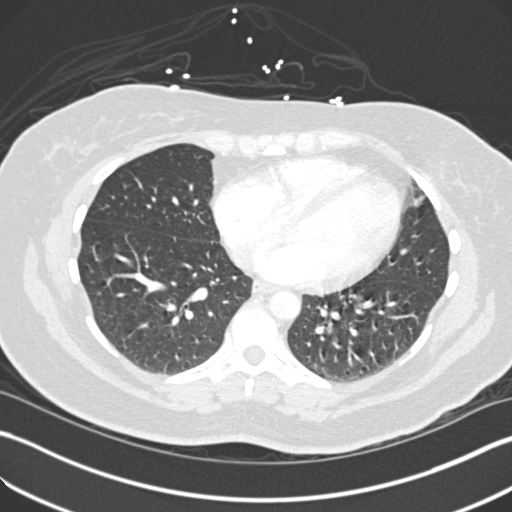
[im 102/271  mediastinal]
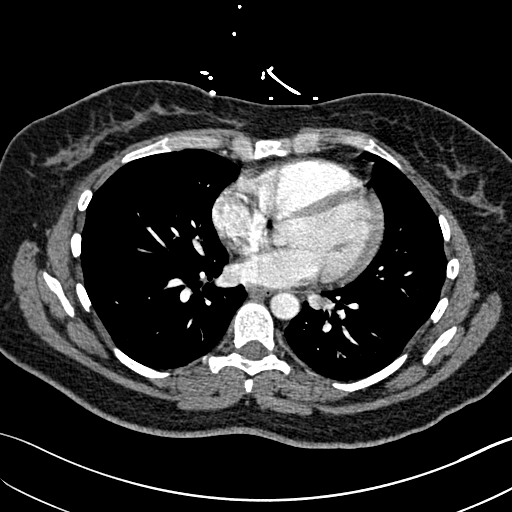
[im 119/271  lung]
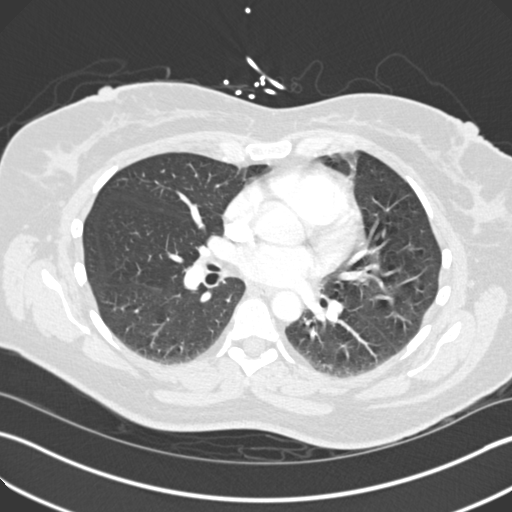
[im 136/271  mediastinal]
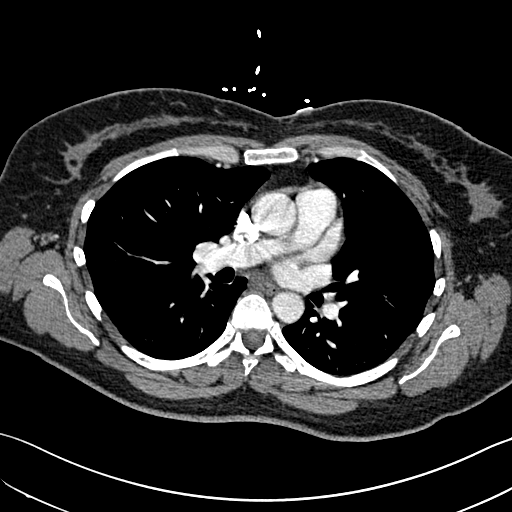
[im 152/271  lung]
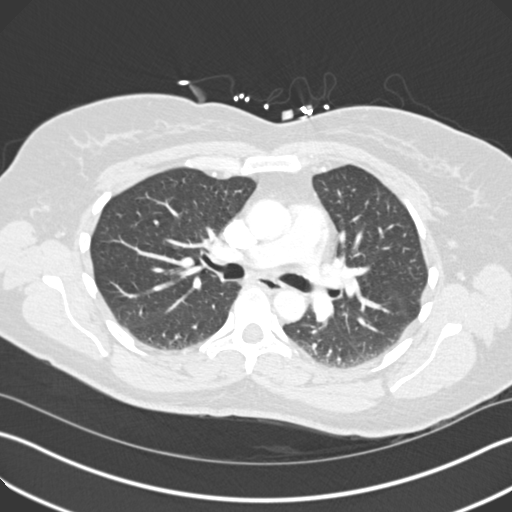
[im 169/271  mediastinal]
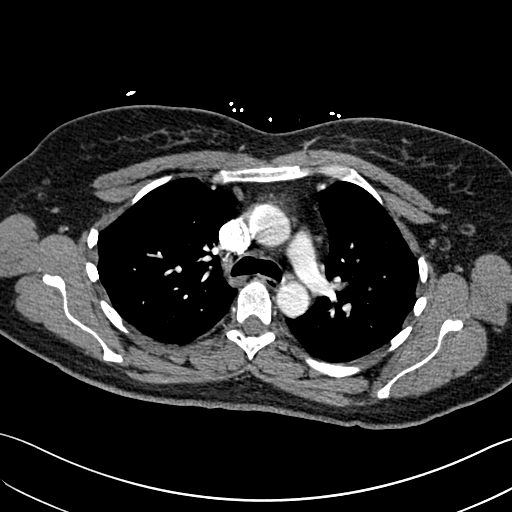
[im 186/271  lung]
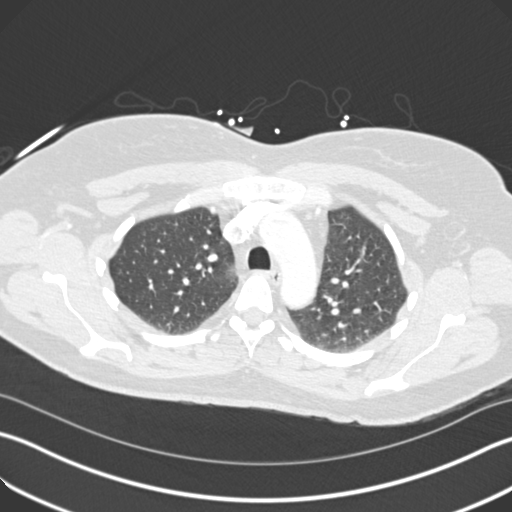
[im 203/271  mediastinal]
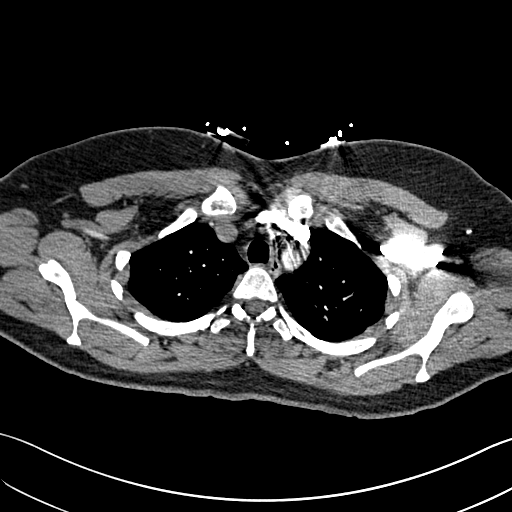
[im 220/271  lung]
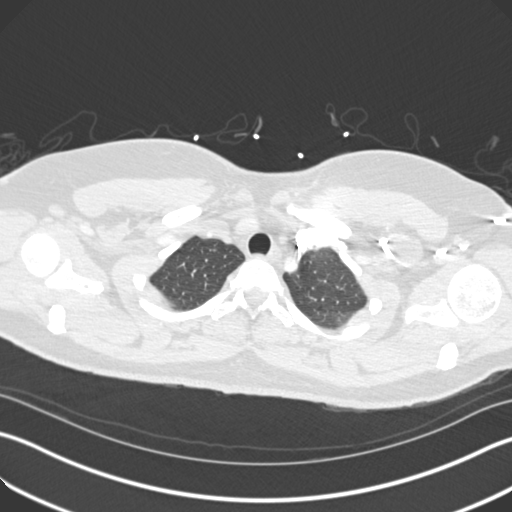
[im 237/271  mediastinal]
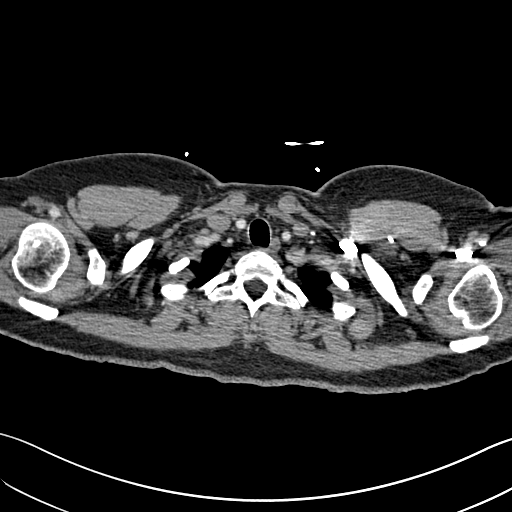
[im 254/271  lung]
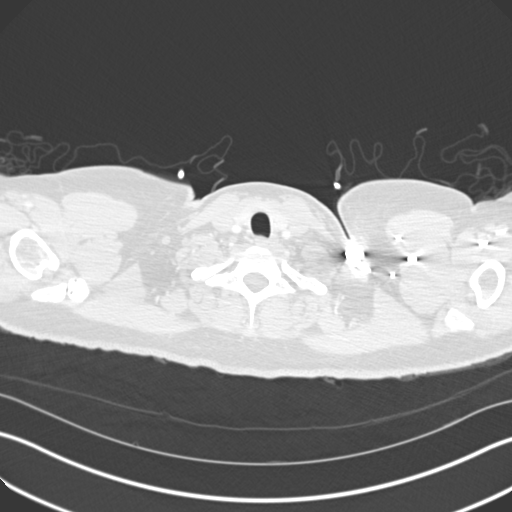

[Series 7: lung windows · axial · 0.73mm/px · z∈[-202,-76]mm · 3 of 86 slices shown]
[im 22/86  mediastinal]
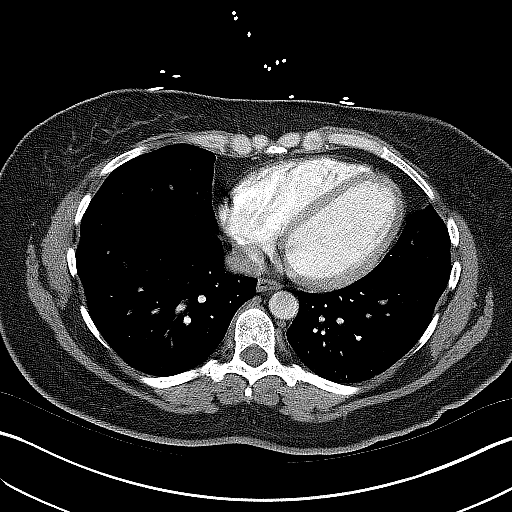
[im 43/86  mediastinal]
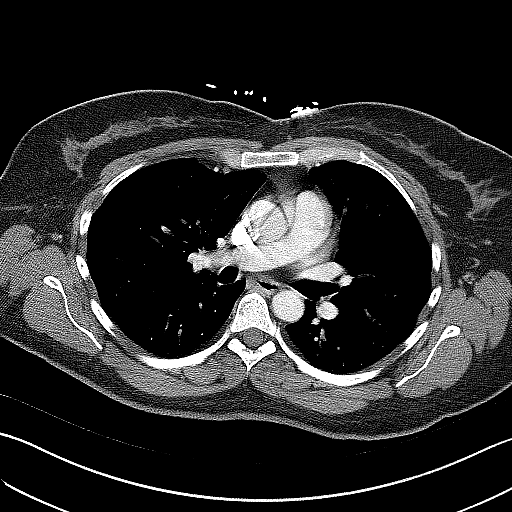
[im 64/86  mediastinal]
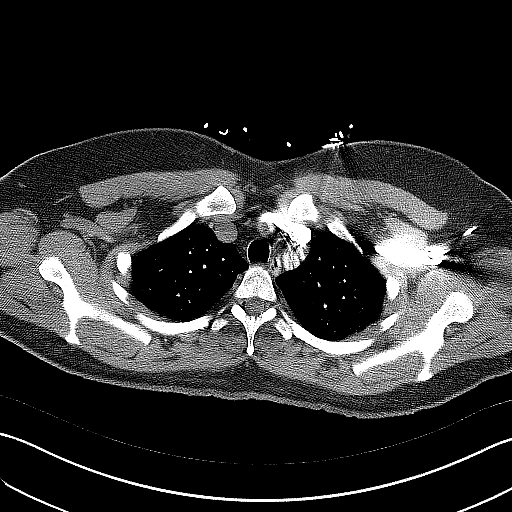

[Series 8: coronal mpr · coronal · 0.55mm/px · 1 of 145 slices shown]
[im 73/145  mediastinal]
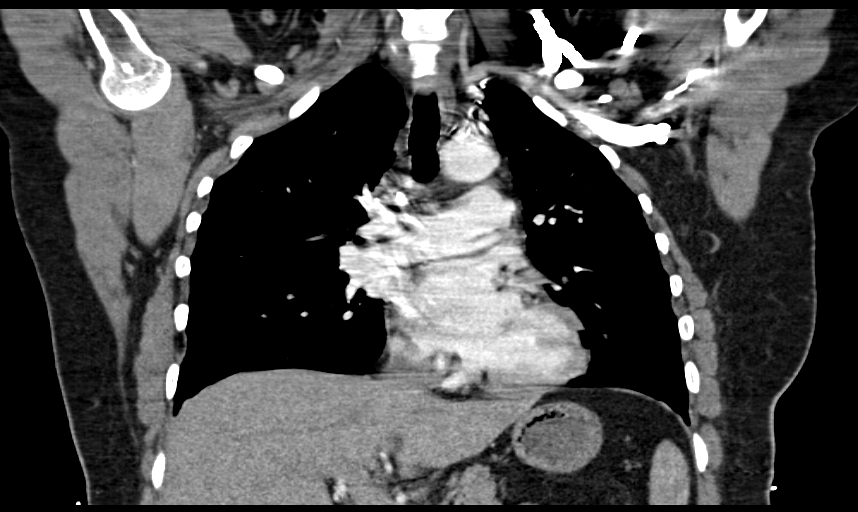

[19 of 36 positions shown; findings below may reference images not displayed]

FINDINGS: Cardiovascular: Evaluation for pulmonary emboli is limited due to
respiratory motion and stairstep artifact. Within this limitation,
no PE are identified. The heart size is normal.

Mediastinum/Nodes: No enlarged mediastinal, hilar, or axillary lymph
nodes. Thyroid gland, trachea, and esophagus demonstrate no
significant findings.

Lungs/Pleura: Lungs are clear. No pleural effusion or pneumothorax.

Upper Abdomen: No acute abnormality.

Musculoskeletal: No chest wall abnormality. No acute or significant
osseous findings.

Review of the MIP images confirms the above findings.
IMPRESSION: 1. Evaluation for pulmonary emboli is somewhat limited as above.
However, no pulmonary emboli identified. No acute abnormalities.

## 2018-08-03 ENCOUNTER — Telehealth: Payer: Self-pay | Admitting: Family

## 2018-08-03 DIAGNOSIS — J329 Chronic sinusitis, unspecified: Secondary | ICD-10-CM

## 2018-08-03 DIAGNOSIS — B9689 Other specified bacterial agents as the cause of diseases classified elsewhere: Secondary | ICD-10-CM

## 2018-08-03 MED ORDER — AMOXICILLIN-POT CLAVULANATE 875-125 MG PO TABS: 1.0000 | ORAL_TABLET | Freq: Two times a day (BID) | ORAL | 0 refills | Status: AC

## 2018-08-03 NOTE — Progress Notes (Signed)
Greater than 5 minutes, yet less than 10 minutes of time have been spent researching, coordinating, and implementing care for this patient today.  Thank you for the details you included in the comment boxes. Those details are very helpful in determining the best course of treatment for you and help us to provide the best care.  We are sorry that you are not feeling well.  Here is how we plan to help!  Based on what you have shared with me it looks like you have sinusitis.  Sinusitis is inflammation and infection in the sinus cavities of the head.  Based on your presentation I believe you most likely have Acute Bacterial Sinusitis.  This is an infection caused by bacteria and is treated with antibiotics. I have prescribed Augmentin 875mg/125mg one tablet twice daily with food, for 7 days. You may use an oral decongestant such as Mucinex D or if you have glaucoma or high blood pressure use plain Mucinex. Saline nasal spray help and can safely be used as often as needed for congestion.  If you develop worsening sinus pain, fever or notice severe headache and vision changes, or if symptoms are not better after completion of antibiotic, please schedule an appointment with a health care provider.    Sinus infections are not as easily transmitted as other respiratory infection, however we still recommend that you avoid close contact with loved ones, especially the very young and elderly.  Remember to wash your hands thoroughly throughout the day as this is the number one way to prevent the spread of infection!  Home Care:  Only take medications as instructed by your medical team.  Complete the entire course of an antibiotic.  Do not take these medications with alcohol.  A steam or ultrasonic humidifier can help congestion.  You can place a towel over your head and breathe in the steam from hot water coming from a faucet.  Avoid close contacts especially the very young and the elderly.  Cover your  mouth when you cough or sneeze.  Always remember to wash your hands.  Get Help Right Away If:  You develop worsening fever or sinus pain.  You develop a severe head ache or visual changes.  Your symptoms persist after you have completed your treatment plan.  Make sure you  Understand these instructions.  Will watch your condition.  Will get help right away if you are not doing well or get worse.  Your e-visit answers were reviewed by a board certified advanced clinical practitioner to complete your personal care plan.  Depending on the condition, your plan could have included both over the counter or prescription medications.  If there is a problem please reply  once you have received a response from your provider.  Your safety is important to us.  If you have drug allergies check your prescription carefully.    You can use MyChart to ask questions about today's visit, request a non-urgent call back, or ask for a work or school excuse for 24 hours related to this e-Visit. If it has been greater than 24 hours you will need to follow up with your provider, or enter a new e-Visit to address those concerns.  You will get an e-mail in the next two days asking about your experience.  I hope that your e-visit has been valuable and will speed your recovery. Thank you for using e-visits.    

## 2018-12-27 ENCOUNTER — Telehealth: Payer: Self-pay | Admitting: Physician Assistant

## 2018-12-27 DIAGNOSIS — J349 Unspecified disorder of nose and nasal sinuses: Secondary | ICD-10-CM

## 2018-12-27 MED ORDER — IPRATROPIUM BROMIDE 0.03 % NA SOLN: 2.0000 | Freq: Three times a day (TID) | NASAL | 0 refills | Status: AC | PRN

## 2018-12-27 MED ORDER — CETIRIZINE HCL 10 MG PO TABS: 10.0000 mg | ORAL_TABLET | Freq: Every day | ORAL | 0 refills | Status: AC

## 2018-12-27 NOTE — Progress Notes (Signed)
We are sorry that you are not feeling well.  Here is how we plan to help!  Based on what you have shared with me it looks like you have sinusitis.  Sinusitis is inflammation and infection in the sinus cavities of the head.  Based on your presentation I believe you most likely have Acute Viral Sinusitis. This is an infection most likely caused by a virus. There is not specific treatment for viral sinusitis other than to help you with the symptoms until the infection runs its course. The vast majority of sinus infections are viral in nature and do not require antibiotics for treatment.  You may use an oral decongestant such as Mucinex D or if you have glaucoma or high blood pressure use plain Mucinex. Saline nasal spray help and can safely be used as often as needed for congestion, I have prescribed: Ipratropium Bromide nasal spray 0.03% 2 sprays in eah nostril 2-3 times a day. I also recommend taking allergy medications such as zyrtec and benadryl which can help with congestion and sinus pressure.   Some authorities believe that zinc sprays or the use of Echinacea may shorten the course of your symptoms.  Sinus infections are not as easily transmitted as other respiratory infection, however we still recommend that you avoid close contact with loved ones, especially the very young and elderly.  Remember to wash your hands thoroughly throughout the day as this is the number one way to prevent the spread of infection!  Home Care:  Only take medications as instructed by your medical team.  Do not take these medications with alcohol.  A steam or ultrasonic humidifier can help congestion.  You can place a towel over your head and breathe in the steam from hot water coming from a faucet.  Avoid close contacts especially the very young and the elderly.  Cover your mouth when you cough or sneeze.  Always remember to wash your hands.  Get Help Right Away If:  You develop worsening fever or sinus  pain.  You develop a severe head ache or visual changes.  Your symptoms persist after you have completed your treatment plan.  Make sure you  Understand these instructions.  Will watch your condition.  Will get help right away if you are not doing well or get worse.  Your e-visit answers were reviewed by a board certified advanced clinical practitioner to complete your personal care plan.  Depending on the condition, your plan could have included both over the counter or prescription medications.  If there is a problem please reply  once you have received a response from your provider.  Your safety is important to [place].  If you have drug allergies check your prescription carefully.    You can use MyChart to ask questions about today's visit, request a non-urgent call back, or ask for a work or school excuse for 24 hours related to this e-Visit. If it has been greater than 24 hours you will need to follow up with your provider, or enter a new e-Visit to address those concerns.  You will get an e-mail in the next two days asking about your experience.  I hope that your e-visit has been valuable and will speed your recovery. Thank you for using e-visits.  Greater than 5 minutes, yet less than 10 minutes of time have been spent researching, coordinating, and implementing care for this patient today.
